# Patient Record
Sex: Male | Born: 1940
Health system: Southern US, Community
[De-identification: ages and names within clinical notes are randomized; demographics above are authoritative.]

## PROBLEM LIST (undated history)

## (undated) DIAGNOSIS — I10 Essential (primary) hypertension: Secondary | ICD-10-CM

## (undated) DIAGNOSIS — E785 Hyperlipidemia, unspecified: Secondary | ICD-10-CM

## (undated) DIAGNOSIS — I2510 Atherosclerotic heart disease of native coronary artery without angina pectoris: Secondary | ICD-10-CM

## (undated) DIAGNOSIS — Z862 Personal history of diseases of the blood and blood-forming organs and certain disorders involving the immune mechanism: Secondary | ICD-10-CM

## (undated) DIAGNOSIS — R7611 Nonspecific reaction to tuberculin skin test without active tuberculosis: Secondary | ICD-10-CM

## (undated) DIAGNOSIS — I255 Ischemic cardiomyopathy: Secondary | ICD-10-CM

## (undated) DIAGNOSIS — I252 Old myocardial infarction: Secondary | ICD-10-CM

## (undated) DIAGNOSIS — K922 Gastrointestinal hemorrhage, unspecified: Secondary | ICD-10-CM

## (undated) DIAGNOSIS — Z8619 Personal history of other infectious and parasitic diseases: Secondary | ICD-10-CM

## (undated) HISTORY — DX: Hyperlipidemia, unspecified: E78.5

## (undated) HISTORY — DX: Personal history of diseases of the blood and blood-forming organs and certain disorders involving the immune mechanism: Z86.2

## (undated) HISTORY — DX: Gastrointestinal hemorrhage, unspecified: K92.2

## (undated) HISTORY — DX: Essential (primary) hypertension: I10

## (undated) HISTORY — DX: Ischemic cardiomyopathy: I25.5

## (undated) HISTORY — DX: Atherosclerotic heart disease of native coronary artery without angina pectoris: I25.10

## (undated) HISTORY — DX: Personal history of other infectious and parasitic diseases: Z86.19

## (undated) HISTORY — DX: Nonspecific reaction to tuberculin skin test without active tuberculosis: R76.11

## (undated) HISTORY — DX: Old myocardial infarction: I25.2

---
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
## 2009-03-09 ENCOUNTER — Inpatient Hospital Stay (HOSPITAL_COMMUNITY): Admission: EM | Admit: 2009-03-09 | Discharge: 2009-03-14 | Payer: Self-pay | Admitting: Emergency Medicine

## 2009-03-09 HISTORY — PX: CARDIAC CATHETERIZATION: SHX172

## 2009-03-10 ENCOUNTER — Encounter: Payer: Self-pay | Admitting: Cardiology

## 2009-03-10 HISTORY — PX: ESOPHAGOGASTRODUODENOSCOPY: SHX1529

## 2009-04-18 ENCOUNTER — Inpatient Hospital Stay (HOSPITAL_COMMUNITY): Admission: RE | Admit: 2009-04-18 | Discharge: 2009-04-19 | Payer: Self-pay | Admitting: Cardiology

## 2009-04-18 HISTORY — PX: CARDIAC CATHETERIZATION: SHX172

## 2010-01-09 ENCOUNTER — Ambulatory Visit (HOSPITAL_COMMUNITY): Admission: RE | Admit: 2010-01-09 | Discharge: 2010-01-09 | Payer: Self-pay | Admitting: Surgery

## 2010-04-28 ENCOUNTER — Ambulatory Visit: Payer: Self-pay | Admitting: Cardiology

## 2010-09-20 LAB — DIFFERENTIAL
Eosinophils Absolute: 0.1 10*3/uL (ref 0.0–0.7)
Eosinophils Relative: 2 % (ref 0–5)
Lymphs Abs: 1.4 10*3/uL (ref 0.7–4.0)
Monocytes Absolute: 0.4 10*3/uL (ref 0.1–1.0)
Monocytes Relative: 8 % (ref 3–12)
Neutrophils Relative %: 68 % (ref 43–77)

## 2010-09-20 LAB — CBC
HCT: 42.1 % (ref 39.0–52.0)
RBC: 4.69 MIL/uL (ref 4.22–5.81)

## 2010-09-20 LAB — URINALYSIS, ROUTINE W REFLEX MICROSCOPIC
Glucose, UA: NEGATIVE mg/dL
Hgb urine dipstick: NEGATIVE
Specific Gravity, Urine: 1.038 — ABNORMAL HIGH (ref 1.005–1.030)
Urobilinogen, UA: 1 mg/dL (ref 0.0–1.0)
pH: 5 (ref 5.0–8.0)

## 2010-09-20 LAB — BASIC METABOLIC PANEL
Chloride: 108 mEq/L (ref 96–112)
GFR calc Af Amer: >60 mL/min (ref >60)
GFR calc non Af Amer: >60 mL/min (ref >60)
Glucose, Bld: 93 mg/dL (ref 70–99)

## 2010-09-20 LAB — PROTIME-INR
INR: 1.04 (ref 0.00–1.49)
Prothrombin Time: 13.5 seconds (ref 11.6–15.2)

## 2010-10-02 ENCOUNTER — Other Ambulatory Visit: Payer: Self-pay | Admitting: *Deleted

## 2010-10-02 DIAGNOSIS — I2510 Atherosclerotic heart disease of native coronary artery without angina pectoris: Secondary | ICD-10-CM

## 2010-10-02 MED ORDER — CLOPIDOGREL BISULFATE 75 MG PO TABS: 75.0000 mg | ORAL_TABLET | Freq: Every day | ORAL | Status: DC

## 2010-10-02 NOTE — Telephone Encounter (Signed)
escribe medication per fax request  

## 2010-10-08 LAB — BASIC METABOLIC PANEL
GFR calc Af Amer: >60 mL/min (ref >60)
Potassium: 4.1 mEq/L (ref 3.5–5.1)

## 2010-10-08 LAB — CBC
Hemoglobin: 11.6 g/dL — ABNORMAL LOW (ref 13.0–17.0)
Platelets: 163 10*3/uL (ref 150–400)
RDW: 14.6 % (ref 11.5–15.5)

## 2010-10-09 LAB — DIFFERENTIAL
Basophils Absolute: 0 10*3/uL (ref 0.0–0.1)
Basophils Relative: 1 % (ref 0–1)
Lymphs Abs: 2.6 10*3/uL (ref 0.7–4.0)
Monocytes Absolute: 0.5 10*3/uL (ref 0.1–1.0)
Monocytes Relative: 7 % (ref 3–12)
Neutrophils Relative %: 55 % (ref 43–77)

## 2010-10-09 LAB — HEMOGLOBIN AND HEMATOCRIT, BLOOD
HCT: 25.1 % — ABNORMAL LOW (ref 39.0–52.0)
HCT: 25.9 % — ABNORMAL LOW (ref 39.0–52.0)
HCT: 26.2 % — ABNORMAL LOW (ref 39.0–52.0)
HCT: 26.6 % — ABNORMAL LOW (ref 39.0–52.0)
HCT: 26.8 % — ABNORMAL LOW (ref 39.0–52.0)
HCT: 27 % — ABNORMAL LOW (ref 39.0–52.0)
HCT: 27.6 % — ABNORMAL LOW (ref 39.0–52.0)
HCT: 28 % — ABNORMAL LOW (ref 39.0–52.0)
HCT: 29.1 % — ABNORMAL LOW (ref 39.0–52.0)
HCT: 30.9 % — ABNORMAL LOW (ref 39.0–52.0)
HCT: 31.6 % — ABNORMAL LOW (ref 39.0–52.0)
Hemoglobin: 10.6 g/dL — ABNORMAL LOW (ref 13.0–17.0)
Hemoglobin: 10.8 g/dL — ABNORMAL LOW (ref 13.0–17.0)
Hemoglobin: 8.5 g/dL — ABNORMAL LOW (ref 13.0–17.0)
Hemoglobin: 8.6 g/dL — ABNORMAL LOW (ref 13.0–17.0)
Hemoglobin: 8.9 g/dL — ABNORMAL LOW (ref 13.0–17.0)
Hemoglobin: 9.1 g/dL — ABNORMAL LOW (ref 13.0–17.0)
Hemoglobin: 9.2 g/dL — ABNORMAL LOW (ref 13.0–17.0)
Hemoglobin: 9.2 g/dL — ABNORMAL LOW (ref 13.0–17.0)
Hemoglobin: 9.4 g/dL — ABNORMAL LOW (ref 13.0–17.0)
Hemoglobin: 9.6 g/dL — ABNORMAL LOW (ref 13.0–17.0)
Hemoglobin: 9.6 g/dL — ABNORMAL LOW (ref 13.0–17.0)

## 2010-10-09 LAB — CARDIAC PANEL(CRET KIN+CKTOT+MB+TROPI)
CK, MB: 210.7 ng/mL — ABNORMAL HIGH (ref 0.3–4.0)
CK, MB: 279.5 ng/mL — ABNORMAL HIGH (ref 0.3–4.0)
Relative Index: 9.4 — ABNORMAL HIGH (ref 0.0–2.5)
Relative Index: 9.9 — ABNORMAL HIGH (ref 0.0–2.5)
Total CK: 1633 U/L — ABNORMAL HIGH (ref 7–232)
Troponin I: 77.92 ng/mL (ref 0.00–0.06)
Troponin I: >100 ng/mL (ref 0.00–0.06)

## 2010-10-09 LAB — CBC
HCT: 32.7 % — ABNORMAL LOW (ref 39.0–52.0)
HCT: 44.8 % (ref 39.0–52.0)
Hemoglobin: 14.6 g/dL (ref 13.0–17.0)
MCHC: 32.6 g/dL (ref 30.0–36.0)
MCV: 89.3 fL (ref 78.0–100.0)
Platelets: 206 10*3/uL (ref 150–400)
RBC: 3.69 MIL/uL — ABNORMAL LOW (ref 4.22–5.81)
RBC: 5.02 MIL/uL (ref 4.22–5.81)
WBC: 11.7 10*3/uL — ABNORMAL HIGH (ref 4.0–10.5)
WBC: 7.2 10*3/uL (ref 4.0–10.5)

## 2010-10-09 LAB — COMPREHENSIVE METABOLIC PANEL
ALT: 44 U/L (ref 0–53)
Albumin: 2.9 g/dL — ABNORMAL LOW (ref 3.5–5.2)
BUN: 24 mg/dL — ABNORMAL HIGH (ref 6–23)
Calcium: 8 mg/dL — ABNORMAL LOW (ref 8.4–10.5)
Chloride: 112 mEq/L (ref 96–112)
Creatinine, Ser: 0.95 mg/dL (ref 0.4–1.5)
Total Bilirubin: 0.7 mg/dL (ref 0.3–1.2)
Total Protein: 5.1 g/dL — ABNORMAL LOW (ref 6.0–8.3)

## 2010-10-09 LAB — HEMOGLOBIN A1C: Hgb A1c MFr Bld: 5.6 % (ref 4.6–6.1)

## 2010-10-09 LAB — LIPID PANEL
Cholesterol: 215 mg/dL — ABNORMAL HIGH (ref 0–200)
HDL: 42 mg/dL (ref >39)
Total CHOL/HDL Ratio: 5.1 RATIO
Triglycerides: 157 mg/dL — ABNORMAL HIGH (ref <150)
VLDL: 23 mg/dL (ref 0–40)

## 2010-10-09 LAB — CROSSMATCH
ABO/RH(D): A POS
Antibody Screen: NEGATIVE

## 2010-10-09 LAB — PROTIME-INR
INR: 1.1 (ref 0.00–1.49)
Prothrombin Time: 13.9 seconds (ref 11.6–15.2)

## 2010-10-09 LAB — POCT CARDIAC MARKERS: Myoglobin, poc: 115 ng/mL (ref 12–200)

## 2010-10-09 LAB — TSH: TSH: 2.902 u[IU]/mL (ref 0.350–4.500)

## 2010-10-09 LAB — APTT: aPTT: 21 seconds — ABNORMAL LOW (ref 24–37)

## 2010-10-09 LAB — BRAIN NATRIURETIC PEPTIDE: Pro B Natriuretic peptide (BNP): 78 pg/mL (ref 0.0–100.0)

## 2010-10-09 LAB — MAGNESIUM: Magnesium: 2.2 mg/dL (ref 1.5–2.5)

## 2011-01-12 ENCOUNTER — Encounter: Payer: Self-pay | Admitting: Cardiology

## 2011-01-19 ENCOUNTER — Encounter: Payer: Self-pay | Admitting: Cardiology

## 2011-01-19 ENCOUNTER — Ambulatory Visit (INDEPENDENT_AMBULATORY_CARE_PROVIDER_SITE_OTHER): Payer: 59 | Admitting: Cardiology

## 2011-01-19 DIAGNOSIS — E7800 Pure hypercholesterolemia, unspecified: Secondary | ICD-10-CM

## 2011-01-19 DIAGNOSIS — I252 Old myocardial infarction: Secondary | ICD-10-CM

## 2011-01-19 DIAGNOSIS — I10 Essential (primary) hypertension: Secondary | ICD-10-CM

## 2011-01-19 DIAGNOSIS — E785 Hyperlipidemia, unspecified: Secondary | ICD-10-CM

## 2011-01-19 DIAGNOSIS — I2589 Other forms of chronic ischemic heart disease: Secondary | ICD-10-CM

## 2011-01-19 DIAGNOSIS — I255 Ischemic cardiomyopathy: Secondary | ICD-10-CM | POA: Insufficient documentation

## 2011-01-19 DIAGNOSIS — I2510 Atherosclerotic heart disease of native coronary artery without angina pectoris: Secondary | ICD-10-CM

## 2011-01-19 MED ORDER — METOPROLOL TARTRATE 25 MG PO TABS: 25.0000 mg | ORAL_TABLET | Freq: Two times a day (BID) | ORAL | Status: DC

## 2011-01-19 MED ORDER — ROSUVASTATIN CALCIUM 20 MG PO TABS: 20.0000 mg | ORAL_TABLET | Freq: Every day | ORAL | Status: DC

## 2011-01-19 MED ORDER — RAMIPRIL 2.5 MG PO TABS: 5.0000 mg | ORAL_TABLET | Freq: Every day | ORAL | Status: DC

## 2011-01-19 MED ORDER — CLOPIDOGREL BISULFATE 75 MG PO TABS: 75.0000 mg | ORAL_TABLET | Freq: Every day | ORAL | Status: DC

## 2011-01-19 NOTE — Patient Instructions (Signed)
Increase your ramipril to 10 mg daily.  Monitor your blood pressure   We will see you again in 6 months with fasting lab.

## 2011-01-19 NOTE — Assessment & Plan Note (Addendum)
He is clinically doing well and is on appropriate medical therapy. We will follow up again in 6 months and plan on checking fasting lab work at that time. I would consider a followup nuclear stress test after his next visit.

## 2011-01-19 NOTE — Progress Notes (Signed)
   Zachary Liu Date of Birth: [birth year]   History of Present Illness: Zachary Liu is seen today for followup. He states he has been feeling very well. He remains active. He has had no significant chest pain, shortness of breath, or palpitations. He hasn't checked his blood pressure at all. He has fully recovered from his hernia surgery.  Current Outpatient Prescriptions on File Prior to Visit  Medication Sig Dispense Refill  . aspirin 325 MG tablet Take 81 mg by mouth daily.       Zachary Liu Sodium (COLACE PO) Take by mouth daily.        Marland Kitchen DISCONTD: clopidogrel (PLAVIX) 75 MG tablet Take 1 tablet (75 mg total) by mouth daily.  90 tablet  3  . DISCONTD: metoprolol tartrate (LOPRESSOR) 25 MG tablet Take 25 mg by mouth 2 (two) times daily.        Marland Kitchen DISCONTD: ramipril (ALTACE) 2.5 MG tablet Take 2.5 mg by mouth daily.        Marland Kitchen DISCONTD: rosuvastatin (CRESTOR) 20 MG tablet Take 20 mg by mouth daily.        . pantoprazole (PROTONIX) 40 MG tablet Take 40 mg by mouth daily.          No Known Allergies  Past Medical History  Diagnosis Date  . Hyperlipidemia   . Coronary artery disease   . Myocardial infarction of anterior wall greater than eight weeks ago   . Ischemic cardiomyopathy   . Inguinal hernia   . GI bleed     due to a Mallory Weiss tear    Past Surgical History  Procedure Date  . Esophagogastroduodenoscopy 03/10/2009    Mallory-Weiss tears, one of them was actively bleeding/these were injected with epinephrine with control of hemorrhage  . Cardiac catheterization 04/18/2009    with stenting/successful intracoronary stenting of the proximal rt coronary artery with a drug-cluting stent    . Cardiac catheterization 03/09/2009    EF30-35%/severe 3-vessel obstructive atherosclerotic coronary artery disease,this is culprit/total occlusion of the proximal LAD/high-grade stenosis in the proximal rt coronary artery/subsequently noted there was some mod to severe stenosis in  the mid LAD/successful intracoronary stenting of the proximal&mid LAD using drug-eluting stents/severe lt ventricular dysfunction    History  Smoking status  . Former Smoker  . Quit date: 07/06/1999  Smokeless tobacco  . Not on file    History  Alcohol Use No    Family History  Problem Relation Age of Onset  . Peripheral vascular disease Mother   . Valvular heart disease Brother     Review of Systems: All other systems were reviewed and are negative.  Physical Exam: BP 142/94  Pulse 68  Ht 6\' 1"  (1.854 m)  Wt 222 lb (100.699 kg)  BMI 29.29 kg/m2 He is a pleasant white male in no acute distress. He is normocephalic, atraumatic. Pupils equal round and reactive. Sclera are clear. Neck is supple without JVD, adenopathy, thyromegaly, or bruits. Lungs are clear. Cardiac exam reveals a regular rate and rhythm without gallop, murmur, or click. Abdomen is soft and nontender without masses or bruits. Extremities are without edema. Pulses are 2+ and symmetric. He is alert and oriented x3. Cranial nerves II through XII are intact. LABORATORY DATA:   Assessment / Plan:

## 2011-01-19 NOTE — Assessment & Plan Note (Signed)
Blood pressure is significantly elevated today. We will increase his ramipril to 10 mg daily and have asked that he monitor his blood pressure at home.

## 2011-01-19 NOTE — Assessment & Plan Note (Signed)
We'll increase his ACE inhibitor. He will continue on his current dose of metoprolol. He is euvolemic.

## 2011-01-22 ENCOUNTER — Telehealth: Payer: Self-pay | Admitting: Cardiology

## 2011-01-22 ENCOUNTER — Encounter: Payer: Self-pay | Admitting: Cardiology

## 2011-01-22 NOTE — Telephone Encounter (Signed)
Called wanting to discuss his new prescriptions that Dr. Swaziland prescribed before his wife picks them up from the pharmacy. He also has not taken Protonics any more. Please call back. I have pulled the chart.

## 2011-01-22 NOTE — Telephone Encounter (Signed)
Called stating BP at home has always been around 120-117/68-70. Really does not want to change dose of Ramipril to 10 mg daily. Per Dr. Swaziland as long as he keeps check on BP may stay on 2.5 mg daily. States he will watch BP and if is elevated will call us.

## 2011-02-07 ENCOUNTER — Inpatient Hospital Stay (INDEPENDENT_AMBULATORY_CARE_PROVIDER_SITE_OTHER)
Admission: RE | Admit: 2011-02-07 | Discharge: 2011-02-07 | Disposition: A | Discharge status: Home / Self Care | Payer: 59 | Source: Ambulatory Visit | Attending: Family Medicine | Admitting: Family Medicine

## 2011-02-07 DIAGNOSIS — B029 Zoster without complications: Secondary | ICD-10-CM

## 2011-04-29 ENCOUNTER — Other Ambulatory Visit: Payer: Self-pay | Admitting: *Deleted

## 2011-04-29 ENCOUNTER — Other Ambulatory Visit: Payer: Self-pay | Admitting: Cardiology

## 2011-04-29 DIAGNOSIS — I10 Essential (primary) hypertension: Secondary | ICD-10-CM

## 2011-04-29 MED ORDER — NITROGLYCERIN 0.4 MG SL SUBL: 0.4000 mg | SUBLINGUAL_TABLET | SUBLINGUAL | Status: DC | PRN

## 2011-04-29 MED ORDER — METOPROLOL TARTRATE 25 MG PO TABS: 25.0000 mg | ORAL_TABLET | Freq: Two times a day (BID) | ORAL | Status: DC

## 2011-04-29 NOTE — Telephone Encounter (Signed)
Pt stated Md had also prescribed nitro tablet and pt would like a rx for that as well. Please return pt call when RX's are called in.

## 2011-07-06 HISTORY — PX: HERNIA REPAIR: SHX51

## 2011-08-24 ENCOUNTER — Other Ambulatory Visit: Payer: Self-pay | Admitting: *Deleted

## 2011-08-24 ENCOUNTER — Encounter: Payer: Self-pay | Admitting: Cardiology

## 2011-08-24 ENCOUNTER — Other Ambulatory Visit (INDEPENDENT_AMBULATORY_CARE_PROVIDER_SITE_OTHER): Payer: 59

## 2011-08-24 ENCOUNTER — Ambulatory Visit (INDEPENDENT_AMBULATORY_CARE_PROVIDER_SITE_OTHER): Payer: 59 | Admitting: Cardiology

## 2011-08-24 VITALS — BP 124/82 | HR 52 | Ht 73.0 in | Wt 225.0 lb

## 2011-08-24 DIAGNOSIS — E785 Hyperlipidemia, unspecified: Secondary | ICD-10-CM

## 2011-08-24 DIAGNOSIS — I2589 Other forms of chronic ischemic heart disease: Secondary | ICD-10-CM

## 2011-08-24 DIAGNOSIS — I255 Ischemic cardiomyopathy: Secondary | ICD-10-CM

## 2011-08-24 DIAGNOSIS — I2510 Atherosclerotic heart disease of native coronary artery without angina pectoris: Secondary | ICD-10-CM

## 2011-08-24 DIAGNOSIS — I10 Essential (primary) hypertension: Secondary | ICD-10-CM

## 2011-08-24 LAB — LIPID PANEL
HDL: 50.7 mg/dL (ref >39.00)
Total CHOL/HDL Ratio: 3
Triglycerides: 79 mg/dL (ref 0.0–149.0)
VLDL: 15.8 mg/dL (ref 0.0–40.0)

## 2011-08-24 LAB — BASIC METABOLIC PANEL
Calcium: 9 mg/dL (ref 8.4–10.5)
Creatinine, Ser: 1.1 mg/dL (ref 0.4–1.5)
GFR: 71.61 mL/min (ref >60.00)
Sodium: 139 mEq/L (ref 135–145)

## 2011-08-24 LAB — HEPATIC FUNCTION PANEL
Alkaline Phosphatase: 49 U/L (ref 39–117)
Bilirubin, Direct: 0.1 mg/dL (ref 0.0–0.3)

## 2011-08-24 NOTE — Assessment & Plan Note (Signed)
Lab work today demonstrates excellent control of his lipids on Crestor therapy. We will continue his current treatment

## 2011-08-24 NOTE — Patient Instructions (Addendum)
Continue your current medications.  We will call with the results of your lab work today.  I will see you again in 6 months.  

## 2011-08-24 NOTE — Progress Notes (Signed)
   Zachary Liu Date of Birth: Mar 24, 1941   History of Present Illness: [name] is seen today for followup. He states he has been feeling very well. He remains active. He has had no significant chest pain, shortness of breath, or palpitations. He did have a bout of shingles 6 months ago involving his right lower back. This has resolved.  Current Outpatient Prescriptions on File Prior to Visit  Medication Sig Dispense Refill  . aspirin 325 MG tablet Take 81 mg by mouth daily.       . clopidogrel (PLAVIX) 75 MG tablet Take 1 tablet (75 mg total) by mouth daily.  90 tablet  3  . metoprolol tartrate (LOPRESSOR) 25 MG tablet Take 1 tablet (25 mg total) by mouth 2 (two) times daily.  90 tablet  3  . nitroGLYCERIN (NITROSTAT) 0.4 MG SL tablet Place 1 tablet (0.4 mg total) under the tongue every 5 (five) minutes as needed for chest pain.  25 tablet  11  . ramipril (ALTACE) 2.5 MG tablet Take 2.5 mg by mouth daily.        . rosuvastatin (CRESTOR) 20 MG tablet Take 1 tablet (20 mg total) by mouth daily.  90 tablet  3    No Known Allergies  Past Medical History  Diagnosis Date  . Hyperlipidemia   . Coronary artery disease   . Myocardial infarction of anterior wall greater than eight weeks ago   . Ischemic cardiomyopathy   . Inguinal hernia   . GI bleed     due to a Mallory Weiss tear  . HTN (hypertension)   . Hypercholesterolemia   . History of anemia     due to gastrointestinal blood loss    Past Surgical History  Procedure Date  . Esophagogastroduodenoscopy 03/10/2009    Mallory-Weiss tears, one of them was actively bleeding/these were injected with epinephrine with control of hemorrhage  . Cardiac catheterization 04/18/2009    with stenting/successful intracoronary stenting of the proximal rt coronary artery with a drug-cluting stent    . Cardiac catheterization 03/09/2009    EF30-35%/severe 3-vessel obstructive atherosclerotic coronary artery disease,this is  culprit/total occlusion of the proximal LAD/high-grade stenosis in the proximal rt coronary artery/subsequently noted there was some mod to severe stenosis in the mid LAD/successful intracoronary stenting of the proximal&mid LAD using drug-eluting stents/severe lt ventricular dysfunction    History  Smoking status  . Former Smoker  . Quit date: 07/06/1999  Smokeless tobacco  . Not on file    History  Alcohol Use No    Family History  Problem Relation Age of Onset  . Peripheral vascular disease Mother   . Valvular heart disease Brother     Review of Systems: As noted in history of present illness. All other systems were reviewed and are negative.  Physical Exam: BP 124/82  Pulse 52  Ht 6\' 1"  (1.854 m)  Wt 102.059 kg (225 lb)  BMI 29.69 kg/m2 He is a pleasant white male in no acute distress. He is normocephalic, atraumatic. Pupils equal round and reactive. Sclera are clear. Neck is supple without JVD, adenopathy, thyromegaly, or bruits. Lungs are clear. Cardiac exam reveals a regular rate and rhythm without gallop, murmur, or click. Abdomen is soft and nontender without masses or bruits. Extremities are without edema. Pulses are 2+ and symmetric. He is alert and oriented x3. Cranial nerves II through XII are intact. LABORATORY DATA: ECG demonstrates sinus bradycardia with a normal ECG.  Assessment / Plan:

## 2011-08-24 NOTE — Assessment & Plan Note (Signed)
Blood pressure control is satisfactory. 

## 2011-08-24 NOTE — Assessment & Plan Note (Signed)
He is on appropriate therapy with ACE inhibitor and beta blocker therapy. He has no evidence of volume overload. We'll continue his current therapy.

## 2011-08-24 NOTE — Assessment & Plan Note (Signed)
He remains asymptomatic. I'll followup again in 6 months. We will consider a stress Myoview study this summer since it'll be 3 years since his myocardial infarction.

## 2011-09-13 ENCOUNTER — Other Ambulatory Visit: Payer: Self-pay | Admitting: Cardiology

## 2011-09-13 DIAGNOSIS — E7800 Pure hypercholesterolemia, unspecified: Secondary | ICD-10-CM

## 2011-09-13 MED ORDER — RAMIPRIL 2.5 MG PO TABS: 2.5000 mg | ORAL_TABLET | Freq: Every day | ORAL | Status: DC

## 2011-09-13 MED ORDER — ROSUVASTATIN CALCIUM 20 MG PO TABS: 20.0000 mg | ORAL_TABLET | Freq: Every day | ORAL | Status: DC

## 2011-09-13 NOTE — Telephone Encounter (Signed)
Refill   Verified pharmacy as [facility], patient can be reached at hm# [phone] for additional info

## 2011-10-28 ENCOUNTER — Other Ambulatory Visit: Payer: Self-pay | Admitting: Cardiology

## 2012-03-28 ENCOUNTER — Other Ambulatory Visit: Payer: Self-pay | Admitting: Cardiology

## 2012-03-28 ENCOUNTER — Telehealth: Payer: Self-pay | Admitting: Cardiology

## 2012-03-28 NOTE — Telephone Encounter (Signed)
I spoke with Dr Elease Hashimoto s nurse she went ahead and sent refill in as take 1-2 tabs daily as directed/needed. Afterwards i called pt back and he was very thankful and ask can we keep the furosemide at 20mg  tabs because he takes i tab twice a day

## 2012-03-28 NOTE — Telephone Encounter (Signed)
Pt  requested refill of plavix at [facility]

## 2012-05-01 ENCOUNTER — Other Ambulatory Visit: Payer: Self-pay | Admitting: Cardiology

## 2012-06-12 ENCOUNTER — Other Ambulatory Visit: Payer: Self-pay

## 2012-06-12 MED ORDER — NITROGLYCERIN 0.4 MG SL SUBL: 0.4000 mg | SUBLINGUAL_TABLET | SUBLINGUAL | Status: DC | PRN

## 2012-09-04 ENCOUNTER — Other Ambulatory Visit: Payer: Self-pay | Admitting: Cardiology

## 2012-10-19 ENCOUNTER — Other Ambulatory Visit: Payer: Self-pay | Admitting: Cardiology

## 2012-10-26 ENCOUNTER — Other Ambulatory Visit: Payer: Self-pay | Admitting: Cardiology

## 2012-10-26 ENCOUNTER — Other Ambulatory Visit: Payer: Self-pay | Admitting: Emergency Medicine

## 2012-10-26 MED ORDER — RAMIPRIL 2.5 MG PO CAPS: ORAL_CAPSULE | ORAL | Status: DC

## 2012-10-26 MED ORDER — ROSUVASTATIN CALCIUM 20 MG PO TABS: ORAL_TABLET | ORAL | Status: DC

## 2012-10-26 MED ORDER — METOPROLOL TARTRATE 25 MG PO TABS: ORAL_TABLET | ORAL | Status: DC

## 2012-12-15 ENCOUNTER — Ambulatory Visit (INDEPENDENT_AMBULATORY_CARE_PROVIDER_SITE_OTHER): Payer: 59 | Admitting: Cardiology

## 2012-12-15 ENCOUNTER — Encounter: Payer: Self-pay | Admitting: Cardiology

## 2012-12-15 VITALS — BP 124/78 | HR 57 | Ht 73.0 in | Wt 226.0 lb

## 2012-12-15 DIAGNOSIS — I10 Essential (primary) hypertension: Secondary | ICD-10-CM

## 2012-12-15 DIAGNOSIS — I2510 Atherosclerotic heart disease of native coronary artery without angina pectoris: Secondary | ICD-10-CM

## 2012-12-15 DIAGNOSIS — I252 Old myocardial infarction: Secondary | ICD-10-CM

## 2012-12-15 DIAGNOSIS — E785 Hyperlipidemia, unspecified: Secondary | ICD-10-CM

## 2012-12-15 DIAGNOSIS — I255 Ischemic cardiomyopathy: Secondary | ICD-10-CM

## 2012-12-15 DIAGNOSIS — I2589 Other forms of chronic ischemic heart disease: Secondary | ICD-10-CM

## 2012-12-15 MED ORDER — CLOPIDOGREL BISULFATE 75 MG PO TABS: 75.0000 mg | ORAL_TABLET | Freq: Every day | ORAL | Status: DC

## 2012-12-15 MED ORDER — RAMIPRIL 2.5 MG PO CAPS: ORAL_CAPSULE | ORAL | Status: DC

## 2012-12-15 MED ORDER — ROSUVASTATIN CALCIUM 20 MG PO TABS: ORAL_TABLET | ORAL | Status: DC

## 2012-12-15 MED ORDER — NITROGLYCERIN 0.4 MG SL SUBL: 0.4000 mg | SUBLINGUAL_TABLET | SUBLINGUAL | Status: DC | PRN

## 2012-12-15 MED ORDER — METOPROLOL TARTRATE 25 MG PO TABS: ORAL_TABLET | ORAL | Status: DC

## 2012-12-15 NOTE — Progress Notes (Signed)
Eulogio Ditch Date of Birth: 14-Aug-1940   History of Present Illness: Mr. Mierzwa is seen today for followup. He is status post anterior myocardial infarction in September 2010. He had fairly extensive stenting of the proximal and mid LAD. He also had a long stent placed in the proximal right coronary. He states he has been feeling very well. He remains active. He has had no significant chest pain, shortness of breath, or palpitations.   Current outpatient prescriptions:aspirin 81 MG tablet, Take 81 mg by mouth daily., Disp: , Rfl: ;  clopidogrel (PLAVIX) 75 MG tablet, Take 1 tablet (75 mg total) by mouth daily., Disp: 90 tablet, Rfl: 3;  metoprolol tartrate (LOPRESSOR) 25 MG tablet, TAKE 1 TABLET BY MOUTH 2 TIMES DAILY., Disp: 180 tablet, Rfl: 3 nitroGLYCERIN (NITROSTAT) 0.4 MG SL tablet, Place 1 tablet (0.4 mg total) under the tongue every 5 (five) minutes as needed for chest pain., Disp: 25 tablet, Rfl: 11;  ramipril (ALTACE) 2.5 MG capsule, TAKE 1 CAPSULE BY MOUTH DAILY, Disp: 90 capsule, Rfl: 3;  rosuvastatin (CRESTOR) 20 MG tablet, TAKE 1 TABLET BY MOUTH ONCE DAILY, Disp: 90 tablet, Rfl: 3  No Known Allergies  Past Medical History  Diagnosis Date  . Hyperlipidemia   . Coronary artery disease   . Myocardial infarction of anterior wall greater than eight weeks ago   . Ischemic cardiomyopathy   . Inguinal hernia   . GI bleed     due to a Mallory Weiss tear  . HTN (hypertension)   . Hypercholesterolemia   . History of anemia     due to gastrointestinal blood loss    Past Surgical History  Procedure Laterality Date  . Esophagogastroduodenoscopy  03/10/2009    Mallory-Weiss tears, one of them was actively bleeding/these were injected with epinephrine with control of hemorrhage  . Cardiac catheterization  04/18/2009    with stenting/successful intracoronary stenting of the proximal rt coronary artery with a drug-cluting stent    . Cardiac catheterization  03/09/2009   EF30-35%/severe 3-vessel obstructive atherosclerotic coronary artery disease,this is culprit/total occlusion of the proximal LAD/high-grade stenosis in the proximal rt coronary artery/subsequently noted there was some mod to severe stenosis in the mid LAD/successful intracoronary stenting of the proximal&mid LAD using drug-eluting stents/severe lt ventricular dysfunction    History  Smoking status  . Former Smoker  . Quit date: 07/06/1999  Smokeless tobacco  . Not on file    History  Alcohol Use No    Family History  Problem Relation Age of Onset  . Peripheral vascular disease Mother   . Valvular heart disease Brother     Review of Systems: As noted in history of present illness. All other systems were reviewed and are negative.  Physical Exam: BP 124/78  Pulse 57  Ht 6\' 1"  (1.854 m)  Wt 226 lb (102.513 kg)  BMI 29.82 kg/m2 He is a pleasant white male in no acute distress. He is normocephalic, atraumatic. Pupils equal round and reactive. Sclera are clear. Neck is supple without JVD, adenopathy, thyromegaly, or bruits. Lungs are clear. Cardiac exam reveals a regular rate and rhythm without gallop, murmur, or click. Abdomen is soft and nontender without masses or bruits. Extremities are without edema. Pulses are 2+ and symmetric. He is alert and oriented x3. Cranial nerves II through XII are intact. LABORATORY DATA: ECG demonstrates sinus bradycardia with old septal infarct. No acute change.  Assessment / Plan: 1. Coronary disease with remote anterior myocardial infarction. Status post DES  to the proximal and mid LAD. Later DES placement to the proximal RCA. Patient remains asymptomatic. I recommended a followup nuclear stress test. Because of his deductable and coinsurance he would like to postpone this until January. We'll plan on scheduling at bedtime.  2. Ischemic cardiomyopathy.  3. Hypertension-controlled.  4. Hypercholesterolemia-will check fasting lab work including  chemistries and lipid panel as well as a CBC.

## 2012-12-15 NOTE — Patient Instructions (Signed)
Continue your current therapy  We will schedule you for fasting lab work  We will tentatively plan on a nuclear stress test in January.

## 2012-12-19 ENCOUNTER — Other Ambulatory Visit (INDEPENDENT_AMBULATORY_CARE_PROVIDER_SITE_OTHER): Payer: 59

## 2012-12-19 DIAGNOSIS — I10 Essential (primary) hypertension: Secondary | ICD-10-CM

## 2012-12-19 DIAGNOSIS — I2510 Atherosclerotic heart disease of native coronary artery without angina pectoris: Secondary | ICD-10-CM

## 2012-12-19 DIAGNOSIS — E785 Hyperlipidemia, unspecified: Secondary | ICD-10-CM

## 2012-12-19 DIAGNOSIS — I255 Ischemic cardiomyopathy: Secondary | ICD-10-CM

## 2012-12-19 DIAGNOSIS — I252 Old myocardial infarction: Secondary | ICD-10-CM

## 2012-12-19 DIAGNOSIS — I2589 Other forms of chronic ischemic heart disease: Secondary | ICD-10-CM

## 2012-12-19 LAB — BASIC METABOLIC PANEL
BUN: 25 mg/dL — ABNORMAL HIGH (ref 6–23)
Calcium: 9.2 mg/dL (ref 8.4–10.5)
Creatinine, Ser: 1.1 mg/dL (ref 0.4–1.5)
GFR: 69.84 mL/min (ref >60.00)
Potassium: 4.9 mEq/L (ref 3.5–5.1)

## 2012-12-19 LAB — CBC WITH DIFFERENTIAL/PLATELET
Eosinophils Relative: 2 % (ref 0.0–5.0)
Lymphocytes Relative: 28.2 % (ref 12.0–46.0)
Monocytes Relative: 7.7 % (ref 3.0–12.0)
Neutrophils Relative %: 61.7 % (ref 43.0–77.0)
Platelets: 173 10*3/uL (ref 150.0–400.0)
WBC: 5.1 10*3/uL (ref 4.5–10.5)

## 2012-12-19 LAB — LIPID PANEL
Cholesterol: 114 mg/dL (ref 0–200)
LDL Cholesterol: 58 mg/dL (ref 0–99)
Triglycerides: 58 mg/dL (ref 0.0–149.0)
VLDL: 11.6 mg/dL (ref 0.0–40.0)

## 2012-12-19 LAB — HEPATIC FUNCTION PANEL
ALT: 14 U/L (ref 0–53)
Albumin: 3.7 g/dL (ref 3.5–5.2)
Total Protein: 6.6 g/dL (ref 6.0–8.3)

## 2013-09-03 ENCOUNTER — Telehealth: Payer: Self-pay | Admitting: Cardiology

## 2013-09-03 DIAGNOSIS — I2510 Atherosclerotic heart disease of native coronary artery without angina pectoris: Secondary | ICD-10-CM

## 2013-09-03 NOTE — Telephone Encounter (Signed)
Follow up    Pt called to schedule his Stress test.  To Schedule this test we need the type stress test.   Please your scheduler know.

## 2013-09-03 NOTE — Telephone Encounter (Signed)
Returned call to patient he stated he needed to schedule stress test.Schedulers will call back to schedule stress myoview.Advised to hold metoprolol 24 hrs before myoview.Instructions will be mailed to patient.

## 2013-10-04 ENCOUNTER — Ambulatory Visit (HOSPITAL_COMMUNITY): Payer: 59 | Attending: Cardiology | Admitting: Radiology

## 2013-10-04 VITALS — BP 113/78 | Ht 73.0 in | Wt 213.0 lb

## 2013-10-04 DIAGNOSIS — I2510 Atherosclerotic heart disease of native coronary artery without angina pectoris: Secondary | ICD-10-CM | POA: Insufficient documentation

## 2013-10-04 DIAGNOSIS — I428 Other cardiomyopathies: Secondary | ICD-10-CM | POA: Insufficient documentation

## 2013-10-04 DIAGNOSIS — I4949 Other premature depolarization: Secondary | ICD-10-CM

## 2013-10-04 DIAGNOSIS — Z87891 Personal history of nicotine dependence: Secondary | ICD-10-CM | POA: Insufficient documentation

## 2013-10-04 DIAGNOSIS — R9439 Abnormal result of other cardiovascular function study: Secondary | ICD-10-CM | POA: Insufficient documentation

## 2013-10-04 DIAGNOSIS — I10 Essential (primary) hypertension: Secondary | ICD-10-CM | POA: Insufficient documentation

## 2013-10-04 MED ORDER — TECHNETIUM TC 99M SESTAMIBI GENERIC - CARDIOLITE
30.0000 | Freq: Once | INTRAVENOUS | Status: AC | PRN
  Administered 2013-10-04: 30 via INTRAVENOUS

## 2013-10-04 MED ORDER — TECHNETIUM TC 99M SESTAMIBI GENERIC - CARDIOLITE
11.0000 | Freq: Once | INTRAVENOUS | Status: AC | PRN
  Administered 2013-10-04: 11 via INTRAVENOUS

## 2013-10-04 NOTE — Progress Notes (Signed)
[facility] [address] [place], [place] [address] [phone]    Cardiology Nuclear Med Study  Zachary Liu is a 73 y.o. male     MRN : [id number]     DOB: Feb 18, 1941  Procedure Date: 10/04/2013  Nuclear Med Background Indication for Stress Test:  Evaluation for Ischemia;Stent Patency History: CAD;MI;(2010) CATH(2010) ;STENT Artery Prox+mid/Rca 10') Echo 2010' EF:35-40%;Cardiomyopathy Cardiac Risk Factors: History of Smoking, Hypertension and Lipids  Symptoms:  no known complaints   Nuclear Pre-Procedure Caffeine/Decaff Intake:  7:00pm NPO After: 7:00pm   Lungs:  clear O2 Sat: 97% on room air. IV 0.9% NS with Angio Cath:  20g  IV Site: R Antecubital  IV Started by:  Ileene Hutchinson, EMT-P  Chest Size (in):  46 Cup Size: n/a  Height: 6\' 1"  (1.854 m)  Weight:  213 lb (96.616 kg)  BMI:  Body mass index is 28.11 kg/(m^2). Tech Comments:  held lopressor for 24 hrs    Nuclear Med Study 1 or 2 day study: 1 day  Stress Test Type:  Stress  Reading MD: n/a  Order Authorizing Provider:  Peter Martinique.MD.  Resting Radionuclide: Technetium 63m Tetrofosmin  Resting Radionuclide Dose: 11.0 mCi   Stress Radionuclide:  Technetium 5m Tetrofosmin  Stress Radionuclide Dose: 32.0 mCi           Stress Protocol Rest HR: 65 Stress HR: 151  Rest BP: 113/78 Stress BP: 148/69  Exercise Time (min): 7:01 METS: 8.50   Predicted Max HR: 147 bpm % Max HR: 102.72 bpm Rate Pressure Product: 22348   Dose of Adenosine (mg):  n/a Dose of Lexiscan: n/a mg  Dose of Atropine (mg): n/a Dose of Dobutamine: n/a mcg/kg/min (at max HR)  Stress Test Technologist: Ileene Hutchinson, EMT-P  Nuclear Technologist:  Charlton Amor, CNMT     Rest Procedure:  Myocardial perfusion imaging was performed at rest 45 minutes following the intravenous administration of Technetium 2m Sestamibi. Rest ECG: Sinus rhythm, anterior MI.  Stress Procedure:  The patient exercised on the  treadmill utilizing the Bruce Protocol for 7:01 minutes. The patient stopped due to fatigue and denied any chest pain.Pt having frequent PVCs.Technetium 57m Sestamibi was injected at peak exercise and myocardial perfusion imaging was performed after a brief delay. Stress ECG: No significant ST segment change suggestive of ischemia.  QPS Raw Data Images:  Acquisition technically good; LVE. Stress Images:  There is decreased uptake in the distal anterior wall, apex and inferior wall. Rest Images:  There is decreased uptake in the distal anterior wall, apex and distal inferior wall. Subtraction (SDS):  No evidence of ischemia. Transient Ischemic Dilatation (Normal <1.22):  1.08 Lung/Heart Ratio (Normal <0.45):  0.38  Quantitative Gated Spect Images QGS EDV:  144 ml QGS ESV:  82 ml  Impression Exercise Capacity:  Fair exercise capacity. BP Response:  Normal blood pressure response. Clinical Symptoms:  No chest pain or dyspnea. ECG Impression:  No significant ST segment change suggestive of ischemia. Comparison with Prior Nuclear Study: No previous nuclear study performed  Overall Impression:  Intermediate risk stress nuclear study with a large, severe intensity, fixed distal anterior, apical and inferior defect consistent with prior infarct; no ischemia.  LV Ejection Fraction: 43%.  LV Wall Motion:  Global hypokinesis and apical akinesis.  Zachary Liu

## 2013-10-11 ENCOUNTER — Telehealth: Payer: Self-pay | Admitting: *Deleted

## 2013-10-11 NOTE — Telephone Encounter (Signed)
Patient calling to see if Crestor can be switched to another cost effective statin. He says yesterday he talked to a nurse and asked for a cheaper blood thinner other than Plavix. He did not mean to say Plavix, he meant to say Crestor. He does have enough Crestor to last him while Dr. Martinique makes the decision to switch statins. He would like it called in to [facility] and would like a call back. I let him know I will forward this message to Dr. Doug Sou nurse Ms. Cheryl.

## 2013-10-22 ENCOUNTER — Telehealth: Payer: Self-pay

## 2013-10-22 MED ORDER — ATORVASTATIN CALCIUM 40 MG PO TABS: 40.0000 mg | ORAL_TABLET | Freq: Every day | ORAL | Status: DC

## 2013-10-22 NOTE — Telephone Encounter (Signed)
Received message crestor cost too much.Spoke to [place] he advised stop crestor,start atorvastatin 40 mg daily.Advised ok to finish crestor then start atorvastatin.Prescription sent to pharmacy.

## 2013-12-10 ENCOUNTER — Other Ambulatory Visit: Payer: Self-pay | Admitting: Cardiology

## 2014-03-13 ENCOUNTER — Other Ambulatory Visit: Payer: Self-pay | Admitting: Cardiology

## 2014-04-16 ENCOUNTER — Other Ambulatory Visit: Payer: Self-pay | Admitting: Cardiology

## 2014-04-23 NOTE — Progress Notes (Signed)
HPI: FU CAD; previously followed by Dr Martinique. He is status post anterior myocardial infarction in September 2010. He had fairly extensive stenting of the proximal and mid LAD. He also had a long stent placed in the proximal right coronary. Echocardiogram in September 2010 showed an ejection fraction of 40-97%, grade 1 diastolic dysfunction and mild left atrial enlargement. Nuclear study April 2015 showed an ejection fraction of 43%. There is prior anterior, apical and inferior infarct but no ischemia.    Current Outpatient Prescriptions  Medication Sig Dispense Refill  . aspirin 81 MG tablet Take 81 mg by mouth daily.      Marland Kitchen atorvastatin (LIPITOR) 40 MG tablet Take 1 tablet (40 mg total) by mouth daily.  90 tablet  3  . clopidogrel (PLAVIX) 75 MG tablet Take 1 tablet (75 mg total) by mouth once.  30 tablet  6  . metoprolol tartrate (LOPRESSOR) 25 MG tablet Take 1 tablet (25 mg total) by mouth 2 (two) times daily.  60 tablet  6  . nitroGLYCERIN (NITROSTAT) 0.4 MG SL tablet Place 1 tablet (0.4 mg total) under the tongue every 5 (five) minutes as needed for chest pain.  25 tablet  11  . ramipril (ALTACE) 2.5 MG capsule Take 1 capsule (2.5 mg total) by mouth daily.  30 capsule  6   No current facility-administered medications for this visit.     Past Medical History  Diagnosis Date  . Hyperlipidemia   . Coronary artery disease   . Myocardial infarction of anterior wall greater than eight weeks ago   . Ischemic cardiomyopathy   . Inguinal hernia   . GI bleed     due to a Mallory Weiss tear  . HTN (hypertension)   . Hypercholesterolemia   . History of anemia     due to gastrointestinal blood loss    Past Surgical History  Procedure Laterality Date  . Esophagogastroduodenoscopy  03/10/2009    Mallory-Weiss tears, one of them was actively bleeding/these were injected with epinephrine with control of hemorrhage  . Cardiac catheterization  04/18/2009    with stenting/successful  intracoronary stenting of the proximal rt coronary artery with a drug-cluting stent    . Cardiac catheterization  03/09/2009    EF30-35%/severe 3-vessel obstructive atherosclerotic coronary artery disease,this is culprit/total occlusion of the proximal LAD/high-grade stenosis in the proximal rt coronary artery/subsequently noted there was some mod to severe stenosis in the mid LAD/successful intracoronary stenting of the proximal&mid LAD using drug-eluting stents/severe lt ventricular dysfunction    History   Social History  . Marital Status: Married    Spouse Name: N/A    Number of Children: 79  . Years of Education: N/A   Occupational History  . critical care nurse     retired   Social History Main Topics  . Smoking status: Former Smoker    Quit date: 07/06/1999  . Smokeless tobacco: Not on file  . Alcohol Use: No  . Drug Use: Not on file  . Sexual Activity: Not on file   Other Topics Concern  . Not on file   Social History Narrative  . No narrative on file    ROS: no fevers or chills, productive cough, hemoptysis, dysphasia, odynophagia, melena, hematochezia, dysuria, hematuria, rash, seizure activity, orthopnea, PND, pedal edema, claudication. Remaining systems are negative.  Physical Exam: Well-developed well-nourished in no acute distress.  Skin is warm and dry.  HEENT is normal.  Neck is supple.  Chest is  clear to auscultation with normal expansion.  Cardiovascular exam is regular rate and rhythm.  Abdominal exam nontender or distended. No masses palpated. Extremities show no edema. neuro grossly intact  ECG     This encounter was created in error - please disregard.

## 2014-04-24 ENCOUNTER — Telehealth: Payer: Self-pay | Admitting: Cardiology

## 2014-04-24 NOTE — Telephone Encounter (Signed)
Returned call to patient no answer.LMTC. 

## 2014-04-24 NOTE — Telephone Encounter (Signed)
New problem   Pt was on Dr Jacalyn Lefevre sched to come in tomorrow morning at 04/25/14 @8 :45..But this is a Dr Doug Sou pt. Pt thought the entire time he was seeing Dr Martinique. Pt would like a call back from nurse before 11:00 today to clear up this matter. Who is the pt suppose to see, b/c Dr Martinique doesn't have any appt and pt is upset. Please call pt.

## 2014-04-25 ENCOUNTER — Encounter: Payer: 59 | Admitting: Cardiology

## 2014-05-01 ENCOUNTER — Ambulatory Visit (INDEPENDENT_AMBULATORY_CARE_PROVIDER_SITE_OTHER): Payer: 59 | Admitting: Cardiology

## 2014-05-01 ENCOUNTER — Encounter: Payer: Self-pay | Admitting: Cardiology

## 2014-05-01 VITALS — BP 110/74 | HR 56 | Ht 72.0 in | Wt 218.8 lb

## 2014-05-01 DIAGNOSIS — I255 Ischemic cardiomyopathy: Secondary | ICD-10-CM

## 2014-05-01 DIAGNOSIS — Z23 Encounter for immunization: Secondary | ICD-10-CM

## 2014-05-01 DIAGNOSIS — E785 Hyperlipidemia, unspecified: Secondary | ICD-10-CM

## 2014-05-01 DIAGNOSIS — I10 Essential (primary) hypertension: Secondary | ICD-10-CM

## 2014-05-01 DIAGNOSIS — I2510 Atherosclerotic heart disease of native coronary artery without angina pectoris: Secondary | ICD-10-CM

## 2014-05-01 NOTE — Telephone Encounter (Signed)
Appointment scheduled with Dr.Jordan 05/01/14 at 9:15 am.

## 2014-05-01 NOTE — Progress Notes (Signed)
Zachary Liu Date of Birth: July 27, 1940   History of Present Illness: [name] is seen today for followup. He is status post anterior myocardial infarction in September 2010. He had fairly extensive stenting of the proximal and mid LAD. He also had a long stent placed in the proximal right coronary. He had a follow up Myoview study in April 2015 noted below. He states he has been feeling very well. He remains active. He has had no significant chest pain, shortness of breath, or palpitations. He has joined a gym and is getting some regular exercise.    Medication List       This list is accurate as of: 05/01/14  5:09 PM.  Always use your most recent med list.               aspirin 81 MG tablet  Take 81 mg by mouth daily.     atorvastatin 40 MG tablet  Commonly known as:  LIPITOR  Take 1 tablet (40 mg total) by mouth daily.     clopidogrel 75 MG tablet  Commonly known as:  PLAVIX  Take 1 tablet (75 mg total) by mouth once.     metoprolol tartrate 25 MG tablet  Commonly known as:  LOPRESSOR  Take 1 tablet (25 mg total) by mouth 2 (two) times daily.     nitroGLYCERIN 0.4 MG SL tablet  Commonly known as:  NITROSTAT  Place 1 tablet (0.4 mg total) under the tongue every 5 (five) minutes as needed for chest pain.     ramipril 2.5 MG capsule  Commonly known as:  ALTACE  Take 1 capsule (2.5 mg total) by mouth daily.        No Known Allergies  Past Medical History  Diagnosis Date  . Hyperlipidemia   . Coronary artery disease   . Myocardial infarction of anterior wall greater than eight weeks ago   . Ischemic cardiomyopathy   . Inguinal hernia   . GI bleed     due to a Mallory Weiss tear  . HTN (hypertension)   . Hypercholesterolemia   . History of anemia     due to gastrointestinal blood loss    Past Surgical History  Procedure Laterality Date  . Esophagogastroduodenoscopy  03/10/2009    Mallory-Weiss tears, one of them was actively bleeding/these  were injected with epinephrine with control of hemorrhage  . Cardiac catheterization  04/18/2009    with stenting/successful intracoronary stenting of the proximal rt coronary artery with a drug-cluting stent    . Cardiac catheterization  03/09/2009    EF30-35%/severe 3-vessel obstructive atherosclerotic coronary artery disease,this is culprit/total occlusion of the proximal LAD/high-grade stenosis in the proximal rt coronary artery/subsequently noted there was some mod to severe stenosis in the mid LAD/successful intracoronary stenting of the proximal&mid LAD using drug-eluting stents/severe lt ventricular dysfunction    History  Smoking status  . Former Smoker  . Quit date: 07/06/1999  Smokeless tobacco  . Not on file    History  Alcohol Use No    Family History  Problem Relation Age of Onset  . Peripheral vascular disease Mother   . Valvular heart disease Brother     Review of Systems: As noted in history of present illness. All other systems were reviewed and are negative.  Physical Exam: BP 110/74  Pulse 56  Ht 6' (1.829 m)  Wt 218 lb 12.8 oz (99.247 kg)  BMI 29.67 kg/m2 He is a pleasant white male in no  acute distress. He is normocephalic, atraumatic. Pupils equal round and reactive. Sclera are clear. Neck is supple without JVD, adenopathy, thyromegaly, or bruits. Lungs are clear. Cardiac exam reveals a regular rate and rhythm without gallop, murmur, or click. Abdomen is soft and nontender without masses or bruits. Extremities are without edema. Pulses are 2+ and symmetric. He is alert and oriented x3. Cranial nerves II through XII are intact.  LABORATORY DATA: ECG demonstrates sinus bradycardia with old septal infarct. Rate 56 bpm. No acute change.I have personally reviewed and interpreted this study.  Cardiology Nuclear Med Study  Zachary Liu is a 73 y.o. male MRN : [id number] DOB: 1940-09-03  Procedure Date: 10/04/2013  Nuclear Med Background  Indication for  Stress Test: Evaluation for Ischemia;Stent Patency  History: CAD;MI;(2010) CATH(2010) ;STENT Artery Prox+mid/Rca 10') Echo 2010' EF:35-40%;Cardiomyopathy  Cardiac Risk Factors: History of Smoking, Hypertension and Lipids  Symptoms: no known complaints  Nuclear Pre-Procedure  Caffeine/Decaff Intake: 7:00pm  NPO After: 7:00pm   Lungs: clear  O2 Sat: 97% on room air.  IV 0.9% NS with Angio Cath: 20g   IV Site: R Antecubital  IV Started by: Ileene Hutchinson, EMT-P   Chest Size (in): 46  Cup Size: n/a   Height: 6\' 1"  (1.854 m)  Weight: 213 lb (96.616 kg)   BMI: Body mass index is 28.11 kg/(m^2).  Tech Comments: held lopressor for 24 hrs   Nuclear Med Study  1 or 2 day study: 1 day  Stress Test Type: Stress   Reading MD: n/a  Order Authorizing Provider: Peter Martinique.MD.   Resting Radionuclide: Technetium 46m Tetrofosmin  Resting Radionuclide Dose: 11.0 mCi   Stress Radionuclide: Technetium 47m Tetrofosmin  Stress Radionuclide Dose: 32.0 mCi   Stress Protocol  Rest HR: 65  Stress HR: 151   Rest BP: 113/78  Stress BP: 148/69   Exercise Time (min): 7:01  METS: 8.50   Predicted Max HR: 147 bpm  % Max HR: 102.72 bpm  Rate Pressure Product: 22348  Dose of Adenosine (mg): n/a  Dose of Lexiscan: n/a mg   Dose of Atropine (mg): n/a  Dose of Dobutamine: n/a mcg/kg/min (at max HR)   Stress Test Technologist: Ileene Hutchinson, EMT-P  Nuclear Technologist: Charlton Amor, CNMT   Rest Procedure: Myocardial perfusion imaging was performed at rest 45 minutes following the intravenous administration of Technetium 46m Sestamibi.  Rest ECG: Sinus rhythm, anterior MI.  Stress Procedure: The patient exercised on the treadmill utilizing the Bruce Protocol for 7:01 minutes. The patient stopped due to fatigue and denied any chest pain.Pt having frequent PVCs.Technetium 47m Sestamibi was injected at peak exercise and myocardial perfusion imaging was performed after a brief delay.  Stress ECG: No significant ST segment  change suggestive of ischemia.  QPS  Raw Data Images: Acquisition technically good; LVE.  Stress Images: There is decreased uptake in the distal anterior wall, apex and inferior wall.  Rest Images: There is decreased uptake in the distal anterior wall, apex and distal inferior wall.  Subtraction (SDS): No evidence of ischemia.  Transient Ischemic Dilatation (Normal <1.22): 1.08  Lung/Heart Ratio (Normal <0.45): 0.38  Quantitative Gated Spect Images  QGS EDV: 144 ml  QGS ESV: 82 ml  Impression  Exercise Capacity: Fair exercise capacity.  BP Response: Normal blood pressure response.  Clinical Symptoms: No chest pain or dyspnea.  ECG Impression: No significant ST segment change suggestive of ischemia.  Comparison with Prior Nuclear Study: No previous nuclear study performed  Overall Impression: Intermediate risk  stress nuclear study with a large, severe intensity, fixed distal anterior, apical and inferior defect consistent with prior infarct; no ischemia.  LV Ejection Fraction: 43%. LV Wall Motion: Global hypokinesis and apical akinesis.  Kirk Ruths    Lab Results  Component Value Date   WBC 5.1 12/19/2012   HGB 13.8 12/19/2012   HCT 41.9 12/19/2012   PLT 173.0 12/19/2012   GLUCOSE 98 12/19/2012   CHOL 114 12/19/2012   TRIG 58.0 12/19/2012   HDL 44.20 12/19/2012   LDLCALC 58 12/19/2012   ALT 14 12/19/2012   AST 19 12/19/2012   NA 139 12/19/2012   K 4.9 12/19/2012   CL 109 12/19/2012   CREATININE 1.1 12/19/2012   BUN 25* 12/19/2012   CO2 27 12/19/2012   TSH 2.902 Test methodology is 3rd generation TSH 03/10/2009   INR 1.04 01/02/2010   HGBA1C  Value: 5.6 (NOTE) The ADA recommends the following therapeutic goal for glycemic control related to Hgb A1c measurement: Goal of therapy: <6.5 Hgb A1c  Reference: American Diabetes Association: Clinical Practice Recommendations 2010, Diabetes Care, 2010, 33: (Suppl  1). 03/10/2009      Assessment / Plan: 1. Coronary disease with remote anterior  myocardial infarction. Status post DES to the proximal and mid LAD. Later DES placement to the proximal RCA. Patient remains asymptomatic. Myoview study in April showed old scar but no ischemia.  Continue current medical therapy. On long term DAPT.  2. Ischemic cardiomyopathy. EF 43%. Asymptomatic. Continue ACEi and metoprolol.   3. Hypertension-controlled.  4. Hypercholesterolemia-well controlled. Continue statin therapy

## 2014-05-01 NOTE — Patient Instructions (Signed)
Continue your current therapy  We will schedule you for fasting lab work  I will see you in one year

## 2014-05-02 LAB — BASIC METABOLIC PANEL
BUN: 25 mg/dL — ABNORMAL HIGH (ref 6–23)
CALCIUM: 8.7 mg/dL (ref 8.4–10.5)
CHLORIDE: 106 meq/L (ref 96–112)
CO2: 22 mEq/L (ref 19–32)
Creat: 1.03 mg/dL (ref 0.50–1.35)
Glucose, Bld: 90 mg/dL (ref 70–99)
Potassium: 4.3 mEq/L (ref 3.5–5.3)
SODIUM: 140 meq/L (ref 135–145)

## 2014-05-02 LAB — LIPID PANEL
CHOL/HDL RATIO: 2.8 ratio
Cholesterol: 131 mg/dL (ref 0–200)
HDL: 47 mg/dL (ref >39)
LDL Cholesterol: 71 mg/dL (ref 0–99)
Triglycerides: 66 mg/dL (ref <150)
VLDL: 13 mg/dL (ref 0–40)

## 2014-05-02 LAB — HEPATIC FUNCTION PANEL
ALBUMIN: 3.8 g/dL (ref 3.5–5.2)
ALT: 18 U/L (ref 0–53)
AST: 22 U/L (ref 0–37)
Alkaline Phosphatase: 52 U/L (ref 39–117)
Bilirubin, Direct: 0.2 mg/dL (ref 0.0–0.3)
Indirect Bilirubin: 0.6 mg/dL (ref 0.2–1.2)
TOTAL PROTEIN: 6 g/dL (ref 6.0–8.3)
Total Bilirubin: 0.8 mg/dL (ref 0.2–1.2)

## 2014-05-07 ENCOUNTER — Telehealth: Payer: Self-pay | Admitting: Cardiology

## 2014-05-07 NOTE — Telephone Encounter (Signed)
Returning a call concerning his lab results.

## 2014-05-07 NOTE — Telephone Encounter (Signed)
Patient notified of lab results. Voiced understanding

## 2014-09-11 ENCOUNTER — Other Ambulatory Visit: Payer: Self-pay

## 2014-09-11 MED ORDER — CLOPIDOGREL BISULFATE 75 MG PO TABS: 75.0000 mg | ORAL_TABLET | Freq: Once | ORAL | Status: DC

## 2014-11-25 ENCOUNTER — Other Ambulatory Visit: Payer: Self-pay | Admitting: Cardiology

## 2014-12-11 ENCOUNTER — Other Ambulatory Visit: Payer: Self-pay | Admitting: Cardiology

## 2014-12-11 NOTE — Telephone Encounter (Signed)
Rx(s) sent to pharmacy electronically.  

## 2015-01-27 ENCOUNTER — Telehealth: Payer: Self-pay | Admitting: Cardiology

## 2015-01-27 NOTE — Telephone Encounter (Signed)
Close encounter 

## 2015-02-07 ENCOUNTER — Encounter: Payer: Self-pay | Admitting: Cardiology

## 2015-03-14 ENCOUNTER — Telehealth: Payer: Self-pay | Admitting: Cardiology

## 2015-03-14 MED ORDER — CLOPIDOGREL BISULFATE 75 MG PO TABS: 75.0000 mg | ORAL_TABLET | Freq: Once | ORAL | Status: DC

## 2015-03-14 MED ORDER — ATORVASTATIN CALCIUM 40 MG PO TABS: 40.0000 mg | ORAL_TABLET | Freq: Every day | ORAL | Status: DC

## 2015-03-14 MED ORDER — RAMIPRIL 2.5 MG PO CAPS: 2.5000 mg | ORAL_CAPSULE | Freq: Every day | ORAL | Status: DC

## 2015-03-14 MED ORDER — METOPROLOL TARTRATE 25 MG PO TABS: 25.0000 mg | ORAL_TABLET | Freq: Two times a day (BID) | ORAL | Status: DC

## 2015-03-14 NOTE — Telephone Encounter (Signed)
Rx(s) sent to pharmacy electronically. Patient notified. 

## 2015-03-14 NOTE — Telephone Encounter (Signed)
°  1. Which medications need to be refilled? All  2. Which pharmacy is medication to be sent to? [facility]   3. Do they need a 30 day or 90 day supply? He wants a  90 day   4. Would they like a call back once the medication has been sent to the pharmacy? Yes

## 2015-05-12 ENCOUNTER — Ambulatory Visit: Payer: Medicare Other | Admitting: Cardiology

## 2015-05-15 ENCOUNTER — Encounter: Payer: Self-pay | Admitting: Cardiology

## 2015-05-15 ENCOUNTER — Ambulatory Visit (INDEPENDENT_AMBULATORY_CARE_PROVIDER_SITE_OTHER): Payer: 59 | Admitting: Cardiology

## 2015-05-15 VITALS — BP 116/70 | HR 52 | Ht 73.0 in | Wt 218.2 lb

## 2015-05-15 DIAGNOSIS — I255 Ischemic cardiomyopathy: Secondary | ICD-10-CM

## 2015-05-15 DIAGNOSIS — E785 Hyperlipidemia, unspecified: Secondary | ICD-10-CM | POA: Diagnosis not present

## 2015-05-15 DIAGNOSIS — I10 Essential (primary) hypertension: Secondary | ICD-10-CM

## 2015-05-15 DIAGNOSIS — Z23 Encounter for immunization: Secondary | ICD-10-CM

## 2015-05-15 DIAGNOSIS — K4090 Unilateral inguinal hernia, without obstruction or gangrene, not specified as recurrent: Secondary | ICD-10-CM

## 2015-05-15 DIAGNOSIS — I2510 Atherosclerotic heart disease of native coronary artery without angina pectoris: Secondary | ICD-10-CM | POA: Diagnosis not present

## 2015-05-15 MED ORDER — ATORVASTATIN CALCIUM 40 MG PO TABS: 40.0000 mg | ORAL_TABLET | Freq: Every day | ORAL | Status: DC

## 2015-05-15 MED ORDER — RAMIPRIL 2.5 MG PO CAPS: 2.5000 mg | ORAL_CAPSULE | Freq: Every day | ORAL | Status: DC

## 2015-05-15 MED ORDER — CLOPIDOGREL BISULFATE 75 MG PO TABS: 75.0000 mg | ORAL_TABLET | Freq: Once | ORAL | Status: DC

## 2015-05-15 MED ORDER — METOPROLOL TARTRATE 25 MG PO TABS: 25.0000 mg | ORAL_TABLET | Freq: Two times a day (BID) | ORAL | Status: DC

## 2015-05-15 MED ORDER — NITROSTAT 0.4 MG SL SUBL: 0.4000 mg | SUBLINGUAL_TABLET | SUBLINGUAL | Status: DC | PRN

## 2015-05-15 NOTE — Progress Notes (Signed)
Zachary Liu Date of Birth: [birth year]   History of Present Illness: [name] is seen today for followup CAD. He is status post anterior myocardial infarction in September 2010. He had fairly extensive stenting of the proximal and mid LAD. He also had a long stent placed in the proximal right coronary. He had a follow up Myoview study in April 2015 noted below. He states he has been feeling very well. He remains active. He has had no significant chest pain, shortness of breath, or palpitations. He does have a large right inguinal hernia and wants referral to general surgery.    Medication List       This list is accurate as of: 05/15/15 10:21 AM.  Always use your most recent med list.               aspirin 81 MG tablet  Take 81 mg by mouth daily.     atorvastatin 40 MG tablet  Commonly known as:  LIPITOR  Take 1 tablet (40 mg total) by mouth daily.     clopidogrel 75 MG tablet  Commonly known as:  PLAVIX  Take 1 tablet (75 mg total) by mouth once.     metoprolol tartrate 25 MG tablet  Commonly known as:  LOPRESSOR  Take 1 tablet (25 mg total) by mouth 2 (two) times daily.     NITROSTAT 0.4 MG SL tablet  Generic drug:  nitroGLYCERIN  Place 1 tablet (0.4 mg total) under the tongue every 5 (five) minutes as needed for chest pain.     ramipril 2.5 MG capsule  Commonly known as:  ALTACE  Take 1 capsule (2.5 mg total) by mouth daily.        No Known Allergies  Past Medical History  Diagnosis Date  . Hyperlipidemia   . Coronary artery disease   . Myocardial infarction of anterior wall greater than eight weeks ago   . Ischemic cardiomyopathy   . Inguinal hernia   . GI bleed     due to a Mallory Weiss tear  . HTN (hypertension)   . Hypercholesterolemia   . History of anemia     due to gastrointestinal blood loss    Past Surgical History  Procedure Laterality Date  . Esophagogastroduodenoscopy  03/10/2009    Mallory-Weiss tears, one of them was  actively bleeding/these were injected with epinephrine with control of hemorrhage  . Cardiac catheterization  04/18/2009    with stenting/successful intracoronary stenting of the proximal rt coronary artery with a drug-cluting stent    . Cardiac catheterization  03/09/2009    EF30-35%/severe 3-vessel obstructive atherosclerotic coronary artery disease,this is culprit/total occlusion of the proximal LAD/high-grade stenosis in the proximal rt coronary artery/subsequently noted there was some mod to severe stenosis in the mid LAD/successful intracoronary stenting of the proximal&mid LAD using drug-eluting stents/severe lt ventricular dysfunction    History  Smoking status  . Former Smoker  . Quit date: 07/06/1999  Smokeless tobacco  . Not on file    History  Alcohol Use No    Family History  Problem Relation Age of Onset  . Peripheral vascular disease Mother   . Valvular heart disease Brother     Review of Systems: As noted in history of present illness. All other systems were reviewed and are negative.  Physical Exam: BP 116/70 mmHg  Pulse 52  Ht 6\' 1"  (1.854 m)  Wt 98.969 kg (218 lb 3 oz)  BMI 28.79 kg/m2 He is a pleasant  white male in no acute distress. He is normocephalic, atraumatic. Pupils equal round and reactive. Sclera are clear. Neck is supple without JVD, adenopathy, thyromegaly, or bruits. Lungs are clear. Cardiac exam reveals a regular rate and rhythm without gallop, murmur, or click. Abdomen is soft and nontender without masses or bruits. Extremities are without edema. Pulses are 2+ and symmetric. He is alert and oriented x3. Cranial nerves II through XII are intact.  LABORATORY DATA: ECG demonstrates sinus bradycardia with old septal infarct. Rate 51 bpm. No acute change.I have personally reviewed and interpreted this study.  Cardiology Nuclear Med Study  Zachary Liu is a 74 y.o. male MRN : [id number] DOB: 1940-11-30  Procedure Date: 10/04/2013  Nuclear Med  Background  Indication for Stress Test: Evaluation for Ischemia;Stent Patency  History: CAD;MI;(2010) CATH(2010) ;STENT Artery Prox+mid/Rca 10') Echo 2010' EF:35-40%;Cardiomyopathy  Cardiac Risk Factors: History of Smoking, Hypertension and Lipids  Symptoms: no known complaints  Nuclear Pre-Procedure  Caffeine/Decaff Intake: 7:00pm  NPO After: 7:00pm   Lungs: clear  O2 Sat: 97% on room air.  IV 0.9% NS with Angio Cath: 20g   IV Site: R Antecubital  IV Started by: Ileene Hutchinson, EMT-P   Chest Size (in): 46  Cup Size: n/a   Height: 6\' 1"  (1.854 m)  Weight: 213 lb (96.616 kg)   BMI: Body mass index is 28.11 kg/(m^2).  Tech Comments: held lopressor for 24 hrs   Nuclear Med Study  1 or 2 day study: 1 day  Stress Test Type: Stress   Reading MD: n/a  Order Authorizing Provider: Jyaire Koudelka Martinique.MD.   Resting Radionuclide: Technetium 23m Tetrofosmin  Resting Radionuclide Dose: 11.0 mCi   Stress Radionuclide: Technetium 30m Tetrofosmin  Stress Radionuclide Dose: 32.0 mCi   Stress Protocol  Rest HR: 65  Stress HR: 151   Rest BP: 113/78  Stress BP: 148/69   Exercise Time (min): 7:01  METS: 8.50   Predicted Max HR: 147 bpm  % Max HR: 102.72 bpm  Rate Pressure Product: 22348  Dose of Adenosine (mg): n/a  Dose of Lexiscan: n/a mg   Dose of Atropine (mg): n/a  Dose of Dobutamine: n/a mcg/kg/min (at max HR)   Stress Test Technologist: Ileene Hutchinson, EMT-P  Nuclear Technologist: Charlton Amor, CNMT   Rest Procedure: Myocardial perfusion imaging was performed at rest 45 minutes following the intravenous administration of Technetium 33m Sestamibi.  Rest ECG: Sinus rhythm, anterior MI.  Stress Procedure: The patient exercised on the treadmill utilizing the Bruce Protocol for 7:01 minutes. The patient stopped due to fatigue and denied any chest pain.Pt having frequent PVCs.Technetium 30m Sestamibi was injected at peak exercise and myocardial perfusion imaging was performed after a brief delay.  Stress ECG:  No significant ST segment change suggestive of ischemia.  QPS  Raw Data Images: Acquisition technically good; LVE.  Stress Images: There is decreased uptake in the distal anterior wall, apex and inferior wall.  Rest Images: There is decreased uptake in the distal anterior wall, apex and distal inferior wall.  Subtraction (SDS): No evidence of ischemia.  Transient Ischemic Dilatation (Normal <1.22): 1.08  Lung/Heart Ratio (Normal <0.45): 0.38  Quantitative Gated Spect Images  QGS EDV: 144 ml  QGS ESV: 82 ml  Impression  Exercise Capacity: Fair exercise capacity.  BP Response: Normal blood pressure response.  Clinical Symptoms: No chest pain or dyspnea.  ECG Impression: No significant ST segment change suggestive of ischemia.  Comparison with Prior Nuclear Study: No previous nuclear study performed  Overall Impression: Intermediate risk stress nuclear study with a large, severe intensity, fixed distal anterior, apical and inferior defect consistent with prior infarct; no ischemia.  LV Ejection Fraction: 43%. LV Wall Motion: Global hypokinesis and apical akinesis.  Kirk Ruths    Lab Results  Component Value Date   WBC 5.1 12/19/2012   HGB 13.8 12/19/2012   HCT 41.9 12/19/2012   PLT 173.0 12/19/2012   GLUCOSE 90 05/01/2014   CHOL 131 05/01/2014   TRIG 66 05/01/2014   HDL 47 05/01/2014   LDLCALC 71 05/01/2014   ALT 18 05/01/2014   AST 22 05/01/2014   NA 140 05/01/2014   K 4.3 05/01/2014   CL 106 05/01/2014   CREATININE 1.03 05/01/2014   BUN 25* 05/01/2014   CO2 22 05/01/2014   TSH 2.902 Test methodology is 3rd generation TSH 03/10/2009   INR 1.04 01/02/2010   HGBA1C  03/10/2009    5.6 (NOTE) The ADA recommends the following therapeutic goal for glycemic control related to Hgb A1c measurement: Goal of therapy: <6.5 Hgb A1c  Reference: American Diabetes Association: Clinical Practice Recommendations 2010, Diabetes Care, 2010, 33: (Suppl  1).      Assessment / Plan: 1.  Coronary disease with remote anterior myocardial infarction. Status post DES to the proximal and mid LAD. Later DES placement to the proximal RCA. Patient remains asymptomatic. Myoview study in April showed old scar but no ischemia.  Continue current medical therapy. On long term DAPT.  2. Ischemic cardiomyopathy. EF 43%. Asymptomatic. Continue ACEi and metoprolol.   3. Hypertension-controlled.  4. Hypercholesterolemia-well controlled. Continue statin therapy. Will schedule for fasting chemistries and a lipid panel.   5. Right inguinal hernia. Will refer to Dr Harlow Asa who did left inguinal hernia before.

## 2015-05-15 NOTE — Patient Instructions (Signed)
We will get fasting lab work  Continue your current therapy  We will refer you to Dr. Harlow Asa for your hernia.

## 2015-05-20 LAB — BASIC METABOLIC PANEL
BUN: 21 mg/dL (ref 7–25)
CHLORIDE: 105 mmol/L (ref 98–110)
CO2: 29 mmol/L (ref 20–31)
Calcium: 8.9 mg/dL (ref 8.6–10.3)
Creat: 1 mg/dL (ref 0.70–1.18)
Glucose, Bld: 95 mg/dL (ref 65–99)
POTASSIUM: 4.3 mmol/L (ref 3.5–5.3)
SODIUM: 139 mmol/L (ref 135–146)

## 2015-05-20 LAB — HEPATIC FUNCTION PANEL
ALK PHOS: 54 U/L (ref 40–115)
ALT: 15 U/L (ref 9–46)
AST: 20 U/L (ref 10–35)
Albumin: 3.9 g/dL (ref 3.6–5.1)
BILIRUBIN DIRECT: 0.2 mg/dL (ref <=0.2)
BILIRUBIN INDIRECT: 0.6 mg/dL (ref 0.2–1.2)
TOTAL PROTEIN: 6.2 g/dL (ref 6.1–8.1)
Total Bilirubin: 0.8 mg/dL (ref 0.2–1.2)

## 2015-05-20 LAB — LIPID PANEL
CHOL/HDL RATIO: 2.7 ratio (ref <=5.0)
CHOLESTEROL: 121 mg/dL — AB (ref 125–200)
HDL: 45 mg/dL (ref >=40)
LDL Cholesterol: 62 mg/dL (ref <130)
TRIGLYCERIDES: 72 mg/dL (ref <150)
VLDL: 14 mg/dL (ref <30)

## 2015-05-21 ENCOUNTER — Ambulatory Visit: Payer: Self-pay | Admitting: General Surgery

## 2015-05-21 NOTE — H&P (Signed)
History of Present Illness Ralene Ok MD; 05/21/2015 10:05 AM) Patient words: Evaluate inguinal hernia.  The patient is a 74 year old male who presents with an inguinal hernia. The patient is a 74 year old male who is referred by Dr. Peter Martinique for evaluation of a right inguinal hernia. The patient states there is been there for approximately 1 year. He states his gotten bigger and becoming more painful recently. He said this has limited his activities. She does wear a truss to help with the pain.  The patient previously had a MI in the past as well as heart stents. The patient is currently on Plavix.   Other Problems Ivor Costa, [place]; 05/21/2015 9:47 AM) Inguinal Hernia Myocardial infarction  Past Surgical History Ivor Costa, CMA; 05/21/2015 9:47 AM) Open Inguinal Hernia Surgery Left.  Diagnostic Studies History Ivor Costa, [place]; 05/21/2015 9:47 AM) Colonoscopy never  Allergies Ivor Costa, CMA; 05/21/2015 9:47 AM) No Known Allergies11/16/2016 No Known Drug Allergies11/16/2016  Medication History Ivor Costa, CMA; 05/21/2015 9:47 AM) Atorvastatin Calcium (40MG  Tablet, Oral) Active. Metoprolol Tartrate (25MG  Tablet, Oral) Active. Clopidogrel Bisulfate (75MG  Tablet, Oral) Active. Ramipril (2.5MG  Capsule, Oral) Active. Nitrostat (0.4MG  Tab Sublingual, Sublingual) Active.  Social History Ivor Costa, [place]; 05/21/2015 9:47 AM) Alcohol use Occasional alcohol use. No caffeine use No drug use Tobacco use Former smoker.  Family History Ivor Costa, [place]; 05/21/2015 9:47 AM) Cerebrovascular Accident Mother. Colon Cancer Sister.    Review of Systems Ralene Ok MD; 05/21/2015 10:05 AM) General Not Present- Appetite Loss, Chills, Fatigue, Fever, Night Sweats, Weight Gain and Weight Loss. Skin Not Present- Change in Wart/Mole, Dryness, Hives, Jaundice, New Lesions, Non-Healing Wounds, Rash and Ulcer. HEENT Not Present- Earache,  Hearing Loss, Hoarseness, Nose Bleed, Oral Ulcers, Ringing in the Ears, Seasonal Allergies, Sinus Pain, Sore Throat, Visual Disturbances, Wears glasses/contact lenses and Yellow Eyes. Respiratory Not Present- Bloody sputum, Chronic Cough, Difficulty Breathing, Snoring and Wheezing. Breast Not Present- Breast Mass, Breast Pain, Nipple Discharge and Skin Changes. Cardiovascular Not Present- Chest Pain, Difficulty Breathing Lying Down, Leg Cramps, Palpitations, Rapid Heart Rate, Shortness of Breath and Swelling of Extremities. Gastrointestinal Present- Abdominal Pain. Male Genitourinary Present- Frequency. Not Present- Blood in Urine, Change in Urinary Stream, Impotence, Nocturia, Painful Urination, Urgency and Urine Leakage. Musculoskeletal Not Present- Back Pain, Joint Pain, Joint Stiffness, Muscle Pain, Muscle Weakness and Swelling of Extremities. Neurological Not Present- Decreased Memory, Fainting, Headaches, Numbness, Seizures, Tingling, Tremor, Trouble walking and Weakness. Psychiatric Not Present- Anxiety, Bipolar, Change in Sleep Pattern, Depression, Fearful and Frequent crying. Endocrine Not Present- Cold Intolerance, Excessive Hunger, Hair Changes, Heat Intolerance, Hot flashes and New Diabetes. Hematology Not Present- Easy Bruising, Excessive bleeding, Gland problems, HIV and Persistent Infections.  Vitals Ivor Costa CMA; 05/21/2015 9:47 AM) 05/21/2015 9:46 AM Weight: 213.4 lb Height: 73in Body Surface Area: 2.21 m Body Mass Index: 28.15 kg/m  Temp.: 97.9F(Temporal)  Pulse: 72 (Regular)  Resp.: 18 (Unlabored)  BP: 132/84 (Sitting, Left Arm, Standard)       Physical Exam Ralene Ok MD; 05/21/2015 10:06 AM) General Mental Status-Alert. General Appearance-Consistent with stated age. Hydration-Well hydrated. Voice-Normal.  Head and Neck Head-normocephalic, atraumatic with no lesions or palpable masses. Trachea-midline.  Eye Eyeball -  Bilateral-Extraocular movements intact. Sclera/Conjunctiva - Bilateral-No scleral icterus.  Chest and Lung Exam Chest and lung exam reveals -quiet, even and easy respiratory effort with no use of accessory muscles. Inspection Chest Wall - Normal. Back - normal.  Cardiovascular Cardiovascular examination reveals -normal heart sounds, regular rate and rhythm with no  murmurs.  Abdomen Inspection Skin - Scar - no surgical scars. Hernias - Inguinal hernia - Right - Reducible(large). Palpation/Percussion Normal exam - Soft, Non Tender, No Rebound tenderness, No Rigidity (guarding) and No hepatosplenomegaly. Auscultation Normal exam - Bowel sounds normal.  Neurologic Neurologic evaluation reveals -alert and oriented x 3 with no impairment of recent or remote memory. Mental Status-Normal.  Musculoskeletal Normal Exam - Left-Upper Extremity Strength Normal and Lower Extremity Strength Normal. Normal Exam - Right-Upper Extremity Strength Normal, Lower Extremity Weakness.    Assessment & Plan Ralene Ok MD; 05/21/2015 10:07 AM) RIGHT INGUINAL HERNIA (K40.90) Impression: 74 year old male with a large right inguinal hernia.  1. The patient will have to be off of Plavix for 7 days we will send Dr. Martinique a clearance 2. 1. The patient will like to proceed to the operating room for open right inguinal hernia repair. 3. I discussed with the patient the signs and symptoms of incarceration and strangulation and the need to proceed to the ER should they occur. 4. I discussed with the patient the risks and benefits of the procedure to include but not limited to: Infection, bleeding, damage to surrounding structures, possible need for further surgery, possible nerve pain, and possible recurrence. The patient was understanding and wishes to proceed.

## 2015-05-22 ENCOUNTER — Telehealth: Payer: Self-pay | Admitting: Cardiology

## 2015-05-22 NOTE — Telephone Encounter (Signed)
Pt is returning Cheryl's call from yesterday about his lab results. Please f/u with him

## 2015-05-22 NOTE — Telephone Encounter (Signed)
no answer

## 2015-05-23 NOTE — Telephone Encounter (Signed)
Returned call to patient lab results given. 

## 2015-06-04 ENCOUNTER — Encounter (HOSPITAL_COMMUNITY): Payer: Self-pay | Admitting: *Deleted

## 2015-06-04 MED ORDER — CHLORHEXIDINE GLUCONATE 4 % EX LIQD: 1.0000 "application " | Freq: Once | CUTANEOUS | Status: DC

## 2015-06-04 MED ORDER — CEFAZOLIN SODIUM-DEXTROSE 2-3 GM-% IV SOLR
2.0000 g | INTRAVENOUS | Status: AC
  Administered 2015-06-05: 2 g via INTRAVENOUS
  Filled 2015-06-04: qty 50

## 2015-06-04 NOTE — Progress Notes (Signed)
Anesthesia Chart Review:  Pt is 74 year old male scheduled for open R inguinal hernia repair with mesh on 06/05/2015 with Dr. Rosendo Gros.   Cardiologist is Dr. Peter Martinique.   Pt is a same day work up.   PMH includes:  CAD (2010: extensive stenting in proximal and mid LAD, long stent to proximal RCA), MI, HTN, hyperlipidemia, ischemic cardiomyopathy. Former smoker. BMI 29.   Medications include: ASA, lipitor, plavix, metoprolol, ramipril. Pt stopped plavix 05/28/15.   Pt will need labs DOS.   EKG 05/15/15: sinus bradycardia (51 bpm). Septal infarct, age undetermined.   Nuclear stress test 10/04/13: Intermediate risk stress nuclear study with a large, severe intensity, fixed distal anterior, apical and inferior defect consistent with prior infarct; no ischemia. LV Ejection Fraction: 43%. LV Wall Motion: Global hypokinesis and apical akinesis.  Echo 03/10/09:  1. Left ventricle: The cavity size was normal. Wall thickness was normal. Systolic function was moderately reduced. The estimated ejection fraction was in the range of 35% to 40%. Akinesis of the mid-distal anterior myocardium. Akinesis of the apical myocardium. Doppler parameters are consistent with abnormal left ventricular relaxation (grade 1 diastolic dysfunction). 2. Left atrium: The atrium was mildly dilated.  Dr. Martinique is aware of upcoming surgery.   If labs acceptable DOS, I anticipate pt can proceed as scheduled.   Willeen Cass, FNP-BC [facility]/Anesthesiology Phone: [phone] 06/04/2015 4:58 PM

## 2015-06-04 NOTE — Progress Notes (Signed)
Pt does have cardiac history, is followed by Dr. Martinique. Last visit was 05/15/15 and Dr. Martinique referred him to Dr. Rosendo Gros. No cardiac clearance noted in EPIC, though there is a noted from Dr. Rosendo Gros that he would be sending a clearance request to Dr. Martinique. I called and spoke with Coralyn Mark at Dr. Rosendo Gros' office to see if they have the clearance. She states she will look into finding it.  Pt states he was instructed by Dr. Martinique to stop his Plavix a week ago (last dose 05/28/15) but to continue his Aspirin.  Stress test - 10/04/13 in EPIC  Echo - 2010 in [place].   EKG - 05/15/15 in EPIC.

## 2015-06-05 ENCOUNTER — Encounter (HOSPITAL_COMMUNITY)
Admission: RE | Disposition: A | Discharge status: Home / Self Care | Payer: Self-pay | Source: Ambulatory Visit | Attending: General Surgery

## 2015-06-05 ENCOUNTER — Ambulatory Visit (HOSPITAL_COMMUNITY)
Admission: RE | Admit: 2015-06-05 | Discharge: 2015-06-05 | Disposition: A | Discharge status: Home / Self Care | Payer: 59 | Source: Ambulatory Visit | Attending: General Surgery | Admitting: General Surgery

## 2015-06-05 ENCOUNTER — Encounter (HOSPITAL_COMMUNITY): Payer: Self-pay

## 2015-06-05 ENCOUNTER — Ambulatory Visit (HOSPITAL_COMMUNITY): Payer: 59 | Admitting: Emergency Medicine

## 2015-06-05 DIAGNOSIS — E785 Hyperlipidemia, unspecified: Secondary | ICD-10-CM | POA: Insufficient documentation

## 2015-06-05 DIAGNOSIS — Z7902 Long term (current) use of antithrombotics/antiplatelets: Secondary | ICD-10-CM | POA: Insufficient documentation

## 2015-06-05 DIAGNOSIS — K4090 Unilateral inguinal hernia, without obstruction or gangrene, not specified as recurrent: Secondary | ICD-10-CM | POA: Diagnosis not present

## 2015-06-05 DIAGNOSIS — I10 Essential (primary) hypertension: Secondary | ICD-10-CM | POA: Diagnosis not present

## 2015-06-05 DIAGNOSIS — Z87891 Personal history of nicotine dependence: Secondary | ICD-10-CM | POA: Diagnosis not present

## 2015-06-05 DIAGNOSIS — I252 Old myocardial infarction: Secondary | ICD-10-CM | POA: Insufficient documentation

## 2015-06-05 DIAGNOSIS — I255 Ischemic cardiomyopathy: Secondary | ICD-10-CM | POA: Insufficient documentation

## 2015-06-05 HISTORY — PX: INSERTION OF MESH: SHX5868

## 2015-06-05 HISTORY — PX: INGUINAL HERNIA REPAIR: SHX194

## 2015-06-05 LAB — BASIC METABOLIC PANEL
ANION GAP: 9 (ref 5–15)
BUN: 15 mg/dL (ref 6–20)
CO2: 22 mmol/L (ref 22–32)
CREATININE: 1.09 mg/dL (ref 0.61–1.24)
Calcium: 8.8 mg/dL — ABNORMAL LOW (ref 8.9–10.3)
Chloride: 109 mmol/L (ref 101–111)
Glucose, Bld: 96 mg/dL (ref 65–99)
Potassium: 4.1 mmol/L (ref 3.5–5.1)
SODIUM: 140 mmol/L (ref 135–145)

## 2015-06-05 LAB — CBC
HCT: 43.4 % (ref 39.0–52.0)
HEMOGLOBIN: 14.1 g/dL (ref 13.0–17.0)
MCH: 29.9 pg (ref 26.0–34.0)
MCHC: 32.5 g/dL (ref 30.0–36.0)
MCV: 91.9 fL (ref 78.0–100.0)
PLATELETS: 188 10*3/uL (ref 150–400)
RBC: 4.72 MIL/uL (ref 4.22–5.81)
RDW: 14 % (ref 11.5–15.5)
WBC: 6.7 10*3/uL (ref 4.0–10.5)

## 2015-06-05 SURGERY — REPAIR, HERNIA, INGUINAL, ADULT
Anesthesia: General | Site: Groin | Laterality: Right

## 2015-06-05 MED ORDER — OXYCODONE HCL 5 MG PO TABS: 5.0000 mg | ORAL_TABLET | ORAL | Status: DC | PRN

## 2015-06-05 MED ORDER — EPHEDRINE SULFATE 50 MG/ML IJ SOLN
INTRAMUSCULAR | Status: DC | PRN
  Administered 2015-06-05 (×2): 5 mg via INTRAVENOUS

## 2015-06-05 MED ORDER — ONDANSETRON HCL 4 MG/2ML IJ SOLN
INTRAMUSCULAR | Status: DC | PRN
  Administered 2015-06-05: 4 mg via INTRAVENOUS

## 2015-06-05 MED ORDER — ACETAMINOPHEN 650 MG RE SUPP: 650.0000 mg | RECTAL | Status: DC | PRN

## 2015-06-05 MED ORDER — FENTANYL CITRATE (PF) 100 MCG/2ML IJ SOLN
INTRAMUSCULAR | Status: AC
  Filled 2015-06-05: qty 2

## 2015-06-05 MED ORDER — FENTANYL CITRATE (PF) 250 MCG/5ML IJ SOLN
INTRAMUSCULAR | Status: AC
  Filled 2015-06-05: qty 5

## 2015-06-05 MED ORDER — 0.9 % SODIUM CHLORIDE (POUR BTL) OPTIME
TOPICAL | Status: DC | PRN
  Administered 2015-06-05: 1000 mL

## 2015-06-05 MED ORDER — SODIUM CHLORIDE 0.9 % IJ SOLN: 3.0000 mL | INTRAMUSCULAR | Status: DC | PRN

## 2015-06-05 MED ORDER — PROPOFOL 10 MG/ML IV BOLUS
INTRAVENOUS | Status: AC
  Filled 2015-06-05: qty 20

## 2015-06-05 MED ORDER — FENTANYL CITRATE (PF) 100 MCG/2ML IJ SOLN
25.0000 ug | INTRAMUSCULAR | Status: DC | PRN
  Administered 2015-06-05: 25 ug via INTRAVENOUS
  Administered 2015-06-05: 50 ug via INTRAVENOUS
  Administered 2015-06-05: 25 ug via INTRAVENOUS

## 2015-06-05 MED ORDER — LACTATED RINGERS IV SOLN
INTRAVENOUS | Status: DC
  Administered 2015-06-05: 08:00:00 via INTRAVENOUS

## 2015-06-05 MED ORDER — PROPOFOL 10 MG/ML IV BOLUS
INTRAVENOUS | Status: DC | PRN
  Administered 2015-06-05: 170 mg via INTRAVENOUS

## 2015-06-05 MED ORDER — OXYCODONE HCL 5 MG PO TABS
ORAL_TABLET | ORAL | Status: AC
  Filled 2015-06-05: qty 1

## 2015-06-05 MED ORDER — MIDAZOLAM HCL 2 MG/2ML IJ SOLN
INTRAMUSCULAR | Status: AC
  Filled 2015-06-05: qty 2

## 2015-06-05 MED ORDER — BUPIVACAINE-EPINEPHRINE (PF) 0.25% -1:200000 IJ SOLN
INTRAMUSCULAR | Status: AC
  Filled 2015-06-05: qty 30

## 2015-06-05 MED ORDER — OXYCODONE HCL 5 MG/5ML PO SOLN
5.0000 mg | Freq: Once | ORAL | Status: AC | PRN
Start: 2015-06-05 — End: 2015-06-05

## 2015-06-05 MED ORDER — SUGAMMADEX SODIUM 200 MG/2ML IV SOLN
INTRAVENOUS | Status: AC
  Filled 2015-06-05: qty 2

## 2015-06-05 MED ORDER — OXYCODONE-ACETAMINOPHEN 5-325 MG PO TABS: 1.0000 | ORAL_TABLET | ORAL | Status: DC | PRN

## 2015-06-05 MED ORDER — MORPHINE SULFATE (PF) 2 MG/ML IV SOLN: 2.0000 mg | INTRAVENOUS | Status: DC | PRN

## 2015-06-05 MED ORDER — BUPIVACAINE-EPINEPHRINE 0.25% -1:200000 IJ SOLN
INTRAMUSCULAR | Status: DC | PRN
  Administered 2015-06-05: 30 mL

## 2015-06-05 MED ORDER — ROCURONIUM BROMIDE 100 MG/10ML IV SOLN
INTRAVENOUS | Status: DC | PRN
  Administered 2015-06-05: 50 mg via INTRAVENOUS

## 2015-06-05 MED ORDER — ACETAMINOPHEN 325 MG PO TABS: 650.0000 mg | ORAL_TABLET | ORAL | Status: DC | PRN

## 2015-06-05 MED ORDER — SUGAMMADEX SODIUM 200 MG/2ML IV SOLN
INTRAVENOUS | Status: DC | PRN
  Administered 2015-06-05: 200 mg via INTRAVENOUS

## 2015-06-05 MED ORDER — ONDANSETRON HCL 4 MG/2ML IJ SOLN: 4.0000 mg | Freq: Once | INTRAMUSCULAR | Status: DC | PRN

## 2015-06-05 MED ORDER — FENTANYL CITRATE (PF) 100 MCG/2ML IJ SOLN
INTRAMUSCULAR | Status: DC | PRN
  Administered 2015-06-05: 100 ug via INTRAVENOUS
  Administered 2015-06-05: 50 ug via INTRAVENOUS

## 2015-06-05 MED ORDER — MIDAZOLAM HCL 5 MG/5ML IJ SOLN
INTRAMUSCULAR | Status: DC | PRN
  Administered 2015-06-05: 1 mg via INTRAVENOUS

## 2015-06-05 MED ORDER — GLYCOPYRROLATE 0.2 MG/ML IJ SOLN
INTRAMUSCULAR | Status: DC | PRN
  Administered 2015-06-05: 0.2 mg via INTRAVENOUS

## 2015-06-05 MED ORDER — SODIUM CHLORIDE 0.9 % IJ SOLN: 3.0000 mL | Freq: Two times a day (BID) | INTRAMUSCULAR | Status: DC

## 2015-06-05 MED ORDER — SODIUM CHLORIDE 0.9 % IV SOLN
250.0000 mL | INTRAVENOUS | Status: DC | PRN
  Administered 2015-06-05: 1000 mL via INTRAVENOUS

## 2015-06-05 MED ORDER — LIDOCAINE HCL (CARDIAC) 20 MG/ML IV SOLN
INTRAVENOUS | Status: DC | PRN
  Administered 2015-06-05: 4 mg via INTRAVENOUS

## 2015-06-05 MED ORDER — OXYCODONE HCL 5 MG PO TABS
5.0000 mg | ORAL_TABLET | Freq: Once | ORAL | Status: AC | PRN
  Administered 2015-06-05: 5 mg via ORAL

## 2015-06-05 SURGICAL SUPPLY — 55 items
BLADE SURG 10 STRL SS (BLADE) ×3 IMPLANT
BLADE SURG 15 STRL LF DISP TIS (BLADE) ×1 IMPLANT
BLADE SURG 15 STRL SS (BLADE) ×2
BLADE SURG ROTATE 9660 (MISCELLANEOUS) IMPLANT
CANISTER SUCTION 2500CC (MISCELLANEOUS) IMPLANT
CHLORAPREP W/TINT 26ML (MISCELLANEOUS) ×3 IMPLANT
COVER SURGICAL LIGHT HANDLE (MISCELLANEOUS) ×3 IMPLANT
DRAIN PENROSE 1/2X12 LTX STRL (WOUND CARE) IMPLANT
DRAPE LAPAROTOMY TRNSV 102X78 (DRAPE) ×3 IMPLANT
DRAPE UTILITY XL STRL (DRAPES) ×3 IMPLANT
ELECT CAUTERY BLADE 6.4 (BLADE) ×3 IMPLANT
ELECT REM PT RETURN 9FT ADLT (ELECTROSURGICAL) ×3
ELECTRODE REM PT RTRN 9FT ADLT (ELECTROSURGICAL) ×1 IMPLANT
GAUZE SPONGE 4X4 16PLY XRAY LF (GAUZE/BANDAGES/DRESSINGS) ×3 IMPLANT
GLOVE BIO SURGEON STRL SZ7.5 (GLOVE) ×3 IMPLANT
GLOVE BIO SURGEON STRL SZ8.5 (GLOVE) ×3 IMPLANT
GLOVE BIOGEL PI IND STRL 8 (GLOVE) ×1 IMPLANT
GLOVE BIOGEL PI IND STRL 8.5 (GLOVE) ×1 IMPLANT
GLOVE BIOGEL PI INDICATOR 8 (GLOVE) ×2
GLOVE BIOGEL PI INDICATOR 8.5 (GLOVE) ×2
GLOVE SURG SS PI 7.5 STRL IVOR (GLOVE) ×3 IMPLANT
GOWN STRL REUS W/ TWL LRG LVL3 (GOWN DISPOSABLE) ×1 IMPLANT
GOWN STRL REUS W/ TWL XL LVL3 (GOWN DISPOSABLE) ×1 IMPLANT
GOWN STRL REUS W/TWL LRG LVL3 (GOWN DISPOSABLE) ×2
GOWN STRL REUS W/TWL XL LVL3 (GOWN DISPOSABLE) ×2
KIT BASIN OR (CUSTOM PROCEDURE TRAY) ×3 IMPLANT
KIT ROOM TURNOVER OR (KITS) ×3 IMPLANT
LIQUID BAND (GAUZE/BANDAGES/DRESSINGS) ×3 IMPLANT
MESH PARIETEX PROGRIP RIGHT (Mesh General) ×3 IMPLANT
NEEDLE HYPO 25GX1X1/2 BEV (NEEDLE) ×3 IMPLANT
NS IRRIG 1000ML POUR BTL (IV SOLUTION) ×3 IMPLANT
PACK SURGICAL SETUP 50X90 (CUSTOM PROCEDURE TRAY) ×3 IMPLANT
PAD ARMBOARD 7.5X6 YLW CONV (MISCELLANEOUS) ×6 IMPLANT
PENCIL BUTTON HOLSTER BLD 10FT (ELECTRODE) ×3 IMPLANT
SPECIMEN JAR SMALL (MISCELLANEOUS) ×3 IMPLANT
SPONGE INTESTINAL PEANUT (DISPOSABLE) IMPLANT
SPONGE LAP 18X18 X RAY DECT (DISPOSABLE) ×3 IMPLANT
SUCTION POOLE TIP (SUCTIONS) ×3 IMPLANT
SUT MNCRL AB 4-0 PS2 18 (SUTURE) ×3 IMPLANT
SUT PDS AB 0 CT 36 (SUTURE) IMPLANT
SUT PROLENE 2 0 SH DA (SUTURE) ×3 IMPLANT
SUT VIC AB 0 CT2 27 (SUTURE) ×3 IMPLANT
SUT VIC AB 2-0 SH 27 (SUTURE) ×2
SUT VIC AB 2-0 SH 27X BRD (SUTURE) ×1 IMPLANT
SUT VIC AB 3-0 SH 27 (SUTURE) ×6
SUT VIC AB 3-0 SH 27XBRD (SUTURE) ×3 IMPLANT
SUT VICRYL AB 2 0 TIES (SUTURE) ×3 IMPLANT
SYR BULB 3OZ (MISCELLANEOUS) ×3 IMPLANT
SYR CONTROL 10ML LL (SYRINGE) ×3 IMPLANT
TOWEL OR 17X24 6PK STRL BLUE (TOWEL DISPOSABLE) ×3 IMPLANT
TOWEL OR 17X26 10 PK STRL BLUE (TOWEL DISPOSABLE) ×3 IMPLANT
TRAY FOLEY CATH 16FR SILVER (SET/KITS/TRAYS/PACK) IMPLANT
TUBE CONNECTING 12'X1/4 (SUCTIONS) ×1
TUBE CONNECTING 12X1/4 (SUCTIONS) ×2 IMPLANT
YANKAUER SUCT BULB TIP NO VENT (SUCTIONS) ×3 IMPLANT

## 2015-06-05 NOTE — Transfer of Care (Signed)
Immediate Anesthesia Transfer of Care Note  Patient: Zachary Liu  Procedure(s) Performed: Procedure(s): OPEN RIGHT INGUINAL HERNIA REPAIR WITH MESH (Right) INSERTION OF MESH (Right)  Patient Location: PACU  Anesthesia Type:General  Level of Consciousness: awake, alert , oriented and patient cooperative  Airway & Oxygen Therapy: Patient Spontanous Breathing and Patient connected to nasal cannula oxygen  Post-op Assessment: Report given to RN, Post -op Vital signs reviewed and stable, Patient moving all extremities, Patient moving all extremities X 4 and Patient able to stick tongue midline  Post vital signs: Reviewed and stable  Last Vitals:  Filed Vitals:   06/05/15 0703  BP: 122/74  Pulse: 51  Temp: 36.8 C  Resp: 18    Complications: No apparent anesthesia complications

## 2015-06-05 NOTE — Anesthesia Procedure Notes (Signed)
Procedure Name: Intubation Date/Time: 06/05/2015 8:51 AM Performed by: Shirlyn Goltz Pre-anesthesia Checklist: Patient identified, Emergency Drugs available, Suction available and Patient being monitored Patient Re-evaluated:Patient Re-evaluated prior to inductionOxygen Delivery Method: Circle system utilized Preoxygenation: Pre-oxygenation with 100% oxygen Intubation Type: IV induction Ventilation: Mask ventilation without difficulty and Oral airway inserted - appropriate to patient size Laryngoscope Size: Mac and 4 Grade View: Grade I Tube type: Oral Tube size: 7.5 mm Number of attempts: 1 Airway Equipment and Method: Stylet Placement Confirmation: ETT inserted through vocal cords under direct vision,  positive ETCO2 and breath sounds checked- equal and bilateral Secured at: 21 cm Tube secured with: Tape Dental Injury: Teeth and Oropharynx as per pre-operative assessment

## 2015-06-05 NOTE — Op Note (Signed)
06/05/2015  9:42 AM  PATIENT:  Zachary Liu  74 y.o. male  PRE-OPERATIVE DIAGNOSIS:  RIGHT INGUINAL HERNIA  POST-OPERATIVE DIAGNOSIS:  RIGHT PANTALOON INGUINAL HERNIA  PROCEDURE:  Procedure(s): OPEN RIGHT INGUINAL HERNIA REPAIR WITH MESH (Right) INSERTION OF MESH (Right)  SURGEON:  Surgeon(s) and Role:    * Ralene Ok, MD - Primary  ANESTHESIA:   local, regional and general  EBL:   10CC  BLOOD ADMINISTERED:none  DRAINS: none   LOCAL MEDICATIONS USED:  BUPIVICAINE   SPECIMEN:  Source of Specimen:  HERNIA SAC  DISPOSITION OF SPECIMEN:  PATHOLOGY  COUNTS:  YES  TOURNIQUET:  * No tourniquets in log *  DICTATION: .Dragon Dictation Details of the procedure: The patient was taken back to the operating room. The patient was placed in supine position with bilateral SCDs in place. The patient was prepped and draped in the usual sterile fashion.  After appropriate anitbiotics were confirmed, a time-out was confirmed and all facts were verified.  Quarter percent Marcaine was used to infiltrate the area of the incision and an ilioinguinal nerve block was also placed.   A 5 cm incision was made just 1 cm superior to the inguinal ligament. Bovie cautery was used to maintain hemostasis dissection is carried down to the external oblique.  A standard incision was made laterally, and the external oblique was bluntly dissected away from the surrounding tissue with Metzenbaum scissors. There was a large amount of scar tissue.The external oblique was elevated in the spermatic cord was bluntly dissected away from the surrounding tissue.  The ilioinguinal nerve was identified and ligated with an 2-0 dyed vicryl.   The spermatic cord and the hernia were then bluntly dissected away from the pubic tubercle and a Penrose was placed around the hernia sac in the spermatic cord. The vas deferens was identified and protected at all portions of the case. Dissection of the cremasterics took place  with Bovie cautery. Once the hernia sac was dissected away from the surrrounding cremesteric tissue , the hernia sac was entered laterally. There was no sliding components and no femoral hernia palpated. The hernia sac was highly ligated using 0 Vicryl.  The specimen was sent to pathology. This retracted into the abdomen in the usual fashion. There appeared to be a small direct defect as well. At this time a left sided Progrip mesh was then anchored to the pubic tubercle with a 2-0 Prolene.  This mesh overlapped the direct defect by approximately 2cm.  It was anchored to the shelving edge of the external oblique x 1 and the conjoint tendon cephalad x 1.  The wrap around of the mesh was sutured to the conjoint tendon as well.  The new internal ring did not strangulate the spermatic cord.   The tail was then tucked under the external oblique. At this time the area was irrigated out with sterile saline.    The external oblique was reapproximated using a 2-0 Vicryl in a running fashion. Scarpa's fascia was then reapproximated using a 3-0 Vicryl running fashion. The skin was then reapproximated with 4 Monocryl in a subcuticular fashion. The skin was then dressed with Dermabond.  The patient was taken to the recovery room in stable condition.   PLAN OF CARE: Discharge to home after PACU  PATIENT DISPOSITION:  PACU - hemodynamically stable.   Delay start of Pharmacological VTE agent (>24hrs) due to surgical blood loss or risk of bleeding: not applicable

## 2015-06-05 NOTE — Interval H&P Note (Signed)
History and Physical Interval Note:  06/05/2015 7:27 AM  Zachary Liu  has presented today for surgery, with the diagnosis of RIGHT INGUINAL HERNIA  The various methods of treatment have been discussed with the patient and family. After consideration of risks, benefits and other options for treatment, the patient has consented to  Procedure(s): OPEN RIGHT INGUINAL HERNIA REPAIR WITH MESH (Right) INSERTION OF MESH (Right) as a surgical intervention .  The patient's history has been reviewed, patient examined, no change in status, stable for surgery.  I have reviewed the patient's chart and labs.  Questions were answered to the patient's satisfaction.     Rosario Jacks., Anne Hahn

## 2015-06-05 NOTE — H&P (View-Only) (Signed)
History of Present Illness Ralene Ok MD; 05/21/2015 10:05 AM) Patient words: Evaluate inguinal hernia.  The patient is a 74 year old male who presents with an inguinal hernia. The patient is a 74 year old male who is referred by Dr. Peter Martinique for evaluation of a right inguinal hernia. The patient states there is been there for approximately 1 year. He states his gotten bigger and becoming more painful recently. He said this has limited his activities. She does wear a truss to help with the pain.  The patient previously had a MI in the past as well as heart stents. The patient is currently on Plavix.   Other Problems Ivor Costa, [place]; 05/21/2015 9:47 AM) Inguinal Hernia Myocardial infarction  Past Surgical History Ivor Costa, CMA; 05/21/2015 9:47 AM) Open Inguinal Hernia Surgery Left.  Diagnostic Studies History Ivor Costa, [place]; 05/21/2015 9:47 AM) Colonoscopy never  Allergies Ivor Costa, CMA; 05/21/2015 9:47 AM) No Known Allergies11/16/2016 No Known Drug Allergies11/16/2016  Medication History Ivor Costa, CMA; 05/21/2015 9:47 AM) Atorvastatin Calcium (40MG  Tablet, Oral) Active. Metoprolol Tartrate (25MG  Tablet, Oral) Active. Clopidogrel Bisulfate (75MG  Tablet, Oral) Active. Ramipril (2.5MG  Capsule, Oral) Active. Nitrostat (0.4MG  Tab Sublingual, Sublingual) Active.  Social History Ivor Costa, [place]; 05/21/2015 9:47 AM) Alcohol use Occasional alcohol use. No caffeine use No drug use Tobacco use Former smoker.  Family History Ivor Costa, [place]; 05/21/2015 9:47 AM) Cerebrovascular Accident Mother. Colon Cancer Sister.    Review of Systems Ralene Ok MD; 05/21/2015 10:05 AM) General Not Present- Appetite Loss, Chills, Fatigue, Fever, Night Sweats, Weight Gain and Weight Loss. Skin Not Present- Change in Wart/Mole, Dryness, Hives, Jaundice, New Lesions, Non-Healing Wounds, Rash and Ulcer. HEENT Not Present- Earache,  Hearing Loss, Hoarseness, Nose Bleed, Oral Ulcers, Ringing in the Ears, Seasonal Allergies, Sinus Pain, Sore Throat, Visual Disturbances, Wears glasses/contact lenses and Yellow Eyes. Respiratory Not Present- Bloody sputum, Chronic Cough, Difficulty Breathing, Snoring and Wheezing. Breast Not Present- Breast Mass, Breast Pain, Nipple Discharge and Skin Changes. Cardiovascular Not Present- Chest Pain, Difficulty Breathing Lying Down, Leg Cramps, Palpitations, Rapid Heart Rate, Shortness of Breath and Swelling of Extremities. Gastrointestinal Present- Abdominal Pain. Male Genitourinary Present- Frequency. Not Present- Blood in Urine, Change in Urinary Stream, Impotence, Nocturia, Painful Urination, Urgency and Urine Leakage. Musculoskeletal Not Present- Back Pain, Joint Pain, Joint Stiffness, Muscle Pain, Muscle Weakness and Swelling of Extremities. Neurological Not Present- Decreased Memory, Fainting, Headaches, Numbness, Seizures, Tingling, Tremor, Trouble walking and Weakness. Psychiatric Not Present- Anxiety, Bipolar, Change in Sleep Pattern, Depression, Fearful and Frequent crying. Endocrine Not Present- Cold Intolerance, Excessive Hunger, Hair Changes, Heat Intolerance, Hot flashes and New Diabetes. Hematology Not Present- Easy Bruising, Excessive bleeding, Gland problems, HIV and Persistent Infections.  Vitals Ivor Costa CMA; 05/21/2015 9:47 AM) 05/21/2015 9:46 AM Weight: 213.4 lb Height: 73in Body Surface Area: 2.21 m Body Mass Index: 28.15 kg/m  Temp.: 97.74F(Temporal)  Pulse: 72 (Regular)  Resp.: 18 (Unlabored)  BP: 132/84 (Sitting, Left Arm, Standard)       Physical Exam Ralene Ok MD; 05/21/2015 10:06 AM) General Mental Status-Alert. General Appearance-Consistent with stated age. Hydration-Well hydrated. Voice-Normal.  Head and Neck Head-normocephalic, atraumatic with no lesions or palpable masses. Trachea-midline.  Eye Eyeball -  Bilateral-Extraocular movements intact. Sclera/Conjunctiva - Bilateral-No scleral icterus.  Chest and Lung Exam Chest and lung exam reveals -quiet, even and easy respiratory effort with no use of accessory muscles. Inspection Chest Wall - Normal. Back - normal.  Cardiovascular Cardiovascular examination reveals -normal heart sounds, regular rate and rhythm with no  murmurs.  Abdomen Inspection Skin - Scar - no surgical scars. Hernias - Inguinal hernia - Right - Reducible(large). Palpation/Percussion Normal exam - Soft, Non Tender, No Rebound tenderness, No Rigidity (guarding) and No hepatosplenomegaly. Auscultation Normal exam - Bowel sounds normal.  Neurologic Neurologic evaluation reveals -alert and oriented x 3 with no impairment of recent or remote memory. Mental Status-Normal.  Musculoskeletal Normal Exam - Left-Upper Extremity Strength Normal and Lower Extremity Strength Normal. Normal Exam - Right-Upper Extremity Strength Normal, Lower Extremity Weakness.    Assessment & Plan Ralene Ok MD; 05/21/2015 10:07 AM) RIGHT INGUINAL HERNIA (K40.90) Impression: 74 year old male with a large right inguinal hernia.  1. The patient will have to be off of Plavix for 7 days we will send Dr. Martinique a clearance 2. 1. The patient will like to proceed to the operating room for open right inguinal hernia repair. 3. I discussed with the patient the signs and symptoms of incarceration and strangulation and the need to proceed to the ER should they occur. 4. I discussed with the patient the risks and benefits of the procedure to include but not limited to: Infection, bleeding, damage to surrounding structures, possible need for further surgery, possible nerve pain, and possible recurrence. The patient was understanding and wishes to proceed.

## 2015-06-05 NOTE — Anesthesia Preprocedure Evaluation (Signed)
Anesthesia Evaluation  Patient identified by MRN, date of birth, ID band Patient awake    Reviewed: Allergy & Precautions, NPO status , Patient's Chart, lab work & pertinent test results  Airway Mallampati: II  TM Distance: >3 FB Neck ROM: Full    Dental  (+) Teeth Intact, Dental Advisory Given   Pulmonary former smoker,    breath sounds clear to auscultation       Cardiovascular hypertension,  Rhythm:Regular Rate:Normal     Neuro/Psych    GI/Hepatic   Endo/Other    Renal/GU      Musculoskeletal   Abdominal   Peds  Hematology   Anesthesia Other Findings   Reproductive/Obstetrics                             Anesthesia Physical Anesthesia Plan  ASA: III  Anesthesia Plan: General   Post-op Pain Management:    Induction: Intravenous  Airway Management Planned: Oral ETT  Additional Equipment:   Intra-op Plan:   Post-operative Plan: Extubation in OR  Informed Consent: I have reviewed the patients History and Physical, chart, labs and discussed the procedure including the risks, benefits and alternatives for the proposed anesthesia with the patient or authorized representative who has indicated his/her understanding and acceptance.   Dental advisory given  Plan Discussed with: CRNA and Anesthesiologist  Anesthesia Plan Comments:         Anesthesia Quick Evaluation  

## 2015-06-05 NOTE — Anesthesia Postprocedure Evaluation (Signed)
Anesthesia Post Note  Patient: Zachary Liu  Procedure(s) Performed: Procedure(s) (LRB): OPEN RIGHT INGUINAL HERNIA REPAIR WITH MESH (Right) INSERTION OF MESH (Right)  Patient location during evaluation: PACU Anesthesia Type: General Level of consciousness: awake, awake and alert and oriented Pain management: pain level controlled Vital Signs Assessment: post-procedure vital signs reviewed and stable Respiratory status: spontaneous breathing, nonlabored ventilation and respiratory function stable Anesthetic complications: no    Last Vitals:  Filed Vitals:   06/05/15 1045 06/05/15 1052  BP: 127/80 130/79  Pulse: 63 62  Temp: 36.7 C 36.7 C  Resp: 16 14    Last Pain:  Filed Vitals:   06/05/15 1053  PainSc: 3                  Marris Frontera COKER

## 2015-06-05 NOTE — Discharge Instructions (Signed)
[facility] _______[facility] ° °INGUINAL HERNIA REPAIR: POST OP INSTRUCTIONS ° °Always review your discharge instruction sheet given to you by the facility where your surgery was performed. °IF YOU HAVE DISABILITY OR FAMILY LEAVE FORMS, YOU MUST BRING THEM TO THE OFFICE FOR PROCESSING.   °DO NOT GIVE THEM TO YOUR DOCTOR. ° °1. A  prescription for pain medication may be given to you upon discharge.  Take your pain medication as prescribed, if needed.  If narcotic pain medicine is not needed, then you may take acetaminophen (Tylenol) or ibuprofen (Advil) as needed. °2. Take your usually prescribed medications unless otherwise directed. °3. If you need a refill on your pain medication, please contact your pharmacy.  They will contact our office to request authorization. Prescriptions will not be filled after 5 pm or on week-ends. °4. You should follow a light diet the first 24 hours after arrival home, such as soup and crackers, etc.  Be sure to include lots of fluids daily.  Resume your normal diet the day after surgery. °5. Most patients will experience some swelling and bruising around the umbilicus or in the groin and scrotum.  Ice packs and reclining will help.  Swelling and bruising can take several days to resolve.  °6. It is common to experience some constipation if taking pain medication after surgery.  Increasing fluid intake and taking a stool softener (such as Colace) will usually help or prevent this problem from occurring.  A mild laxative (Milk of Magnesia or Miralax) should be taken according to package directions if there are no bowel movements after 48 hours. °7. Unless discharge instructions indicate otherwise, you may remove your bandages 24-48 hours after surgery, and you may shower at that time.  You may have steri-strips (small skin tapes) in place directly over the incision.  These strips should be left on the skin for 7-10 days.  If your surgeon used skin glue on the incision, you  may shower in 24 hours.  The glue will flake off over the next 2-3 weeks.  Any sutures or staples will be removed at the office during your follow-up visit. °8. ACTIVITIES:  You may resume regular (light) daily activities beginning the next day--such as daily self-care, walking, climbing stairs--gradually increasing activities as tolerated.  You may have sexual intercourse when it is comfortable.  Refrain from any heavy lifting or straining until approved by your doctor. °a. You may drive when you are no longer taking prescription pain medication, you can comfortably wear a seatbelt, and you can safely maneuver your car and apply brakes. °b. RETURN TO WORK:  __________________________________________________________ °9. You should see your doctor in the office for a follow-up appointment approximately 2-3 weeks after your surgery.  Make sure that you call for this appointment within a day or two after you arrive home to insure a convenient appointment time. °10. OTHER INSTRUCTIONS:  __________________________________________________________________________________________________________________________________________________________________________________________  °WHEN TO CALL YOUR DOCTOR: °1. Fever over 101.0 °2. Inability to urinate °3. Nausea and/or vomiting °4. Extreme swelling or bruising °5. Continued bleeding from incision. °6. Increased pain, redness, or drainage from the incision ° °The clinic staff is available to answer your questions during regular business hours.  Please don’t hesitate to call and ask to speak to one of the nurses for clinical concerns.  If you have a medical emergency, go to the nearest emergency room or call 911.  A surgeon from [facility] is always on call at the hospital ° ° °[address], [address], [place], [place]  [address] ? ° [address] [address], [place], [place]   [address] °[phone] ? [phone] ? FAX [phone] °Web site:  [email or web address] ° °

## 2015-06-06 ENCOUNTER — Encounter (HOSPITAL_COMMUNITY): Payer: Self-pay | Admitting: General Surgery

## 2015-07-08 ENCOUNTER — Ambulatory Visit (INDEPENDENT_AMBULATORY_CARE_PROVIDER_SITE_OTHER): Payer: 59 | Admitting: Family Medicine

## 2015-07-08 ENCOUNTER — Encounter: Payer: Self-pay | Admitting: Family Medicine

## 2015-07-08 ENCOUNTER — Encounter (INDEPENDENT_AMBULATORY_CARE_PROVIDER_SITE_OTHER): Payer: Self-pay

## 2015-07-08 VITALS — BP 110/70 | HR 64 | Temp 97.9°F | Ht 71.25 in | Wt 215.5 lb

## 2015-07-08 DIAGNOSIS — I10 Essential (primary) hypertension: Secondary | ICD-10-CM | POA: Diagnosis not present

## 2015-07-08 DIAGNOSIS — E785 Hyperlipidemia, unspecified: Secondary | ICD-10-CM

## 2015-07-08 DIAGNOSIS — I255 Ischemic cardiomyopathy: Secondary | ICD-10-CM | POA: Diagnosis not present

## 2015-07-08 DIAGNOSIS — I2510 Atherosclerotic heart disease of native coronary artery without angina pectoris: Secondary | ICD-10-CM | POA: Diagnosis not present

## 2015-07-08 NOTE — Assessment & Plan Note (Signed)
Continue ACEI, metoprolol

## 2015-07-08 NOTE — Assessment & Plan Note (Signed)
Chronic, stable on lipitor.  

## 2015-07-08 NOTE — Assessment & Plan Note (Signed)
Stable. ?true hypertension vs antihypertensives for cardiac treatment after MI.

## 2015-07-08 NOTE — Progress Notes (Signed)
BP 110/70 mmHg  Pulse 64  Temp(Src) 97.9 F (36.6 C) (Oral)  Ht 5' 11.25" (1.81 m)  Wt 215 lb 8 oz (97.75 kg)  BMI 29.84 kg/m2   CC: new pt to establish care  Subjective:    Patient ID: Zachary Liu, male    DOB: 1941/04/04, 75 y.o.   MRN: [id number]  HPI: EULOGIO RAIKES is a 75 y.o. male presenting on 07/08/2015 for [place] from [place]. I will see wife on Thursday Civil engineer, contracting of [facility]). No prior PCP. Retired Therapist, sports. Intermittently travels to [place].  HTN - unsure if h/o this. Compliant with current antihypertensive regimen of metoprolol 25mg  bid and ramipril 2.5mg  daily.  Does not check blood pressures at home. No low blood pressure readings or symptoms of dizziness/syncope. Denies HA, vision changes, CP/tightness, SOB, leg swelling.   HLD - compliant with lipitor 40mg  daily without myalgias.  CAD s/p MI 2010 with ischemic CM on aspirin and plavix. Had stent of proximal and mid LAD as well as proximal RCA. Sees Dr Martinique. Latest EF 43% with global hypokinesis and apical akinesis.   H/o GI bleed from M-W tear.   Recent R inguinal hernia repair by Dr Rosendo Gros.   Preventative:  No recent CPE  Medicare part A   Moved from [place]. Wife is Mudlogger of [employer].  Occ: Retired Therapist, sports Edu: RN Activity: walking 1.5 mi 3x/wk at [employer] Diet: water, fruits/vegetables daily  Relevant past medical, surgical, family and social history reviewed and updated as indicated. Interim medical history since our last visit reviewed. Allergies and medications reviewed and updated. Current Outpatient Prescriptions on File Prior to Visit  Medication Sig  . aspirin 81 MG tablet Take 81 mg by mouth daily.  Marland Kitchen atorvastatin (LIPITOR) 40 MG tablet Take 1 tablet (40 mg total) by mouth daily.  . metoprolol tartrate (LOPRESSOR) 25 MG tablet Take 1 tablet (25 mg total) by mouth 2 (two) times daily.  Marland Kitchen NITROSTAT 0.4 MG SL tablet Place 1 tablet (0.4 mg total) under the tongue every 5 (five) minutes  as needed for chest pain.  . ramipril (ALTACE) 2.5 MG capsule Take 1 capsule (2.5 mg total) by mouth daily. (Patient taking differently: Take 2.5 mg by mouth at bedtime. )   No current facility-administered medications on file prior to visit.    Review of Systems Per HPI unless specifically indicated in ROS section     Objective:    BP 110/70 mmHg  Pulse 64  Temp(Src) 97.9 F (36.6 C) (Oral)  Ht 5' 11.25" (1.81 m)  Wt 215 lb 8 oz (97.75 kg)  BMI 29.84 kg/m2  Wt Readings from Last 3 Encounters:  07/08/15 215 lb 8 oz (97.75 kg)  06/05/15 218 lb (98.884 kg)  05/15/15 218 lb 3 oz (98.969 kg)    Physical Exam  Constitutional: He is oriented to person, place, and time. He appears well-developed and well-nourished. No distress.  HENT:  Head: Normocephalic and atraumatic.  Right Ear: Hearing, tympanic membrane, external ear and ear canal normal.  Left Ear: Hearing, tympanic membrane, external ear and ear canal normal.  Nose: Nose normal.  Mouth/Throat: Uvula is midline, oropharynx is clear and moist and mucous membranes are normal. No oropharyngeal exudate, posterior oropharyngeal edema or posterior oropharyngeal erythema.  Eyes: Conjunctivae and EOM are normal. Pupils are equal, round, and reactive to light. No scleral icterus.  Neck: Normal range of motion. Neck supple.  Cardiovascular: Normal rate, regular rhythm, normal heart  sounds and intact distal pulses.   No murmur heard. Pulses:      Radial pulses are 2+ on the right side, and 2+ on the left side.  Pulmonary/Chest: Effort normal and breath sounds normal. No respiratory distress. He has no wheezes. He has no rales.  Musculoskeletal: Normal range of motion. He exhibits no edema.  Lymphadenopathy:    He has no cervical adenopathy.  Neurological: He is alert and oriented to person, place, and time.  CN grossly intact, station and gait intact  Skin: Skin is warm and dry. No rash noted.  Psychiatric: He has a normal mood and  affect.  Nursing note and vitals reviewed.  Results for orders placed or performed during the hospital encounter of [id number]  Basic metabolic panel  Result Value Ref Range   Sodium 140 135 - 145 mmol/L   Potassium 4.1 3.5 - 5.1 mmol/L   Chloride 109 101 - 111 mmol/L   CO2 22 22 - 32 mmol/L   Glucose, Bld 96 65 - 99 mg/dL   BUN 15 6 - 20 mg/dL   Creatinine, Ser 1.09 0.61 - 1.24 mg/dL   Calcium 8.8 (L) 8.9 - 10.3 mg/dL   GFR calc non Af Amer >60 >60 mL/min   GFR calc Af Amer >60 >60 mL/min   Anion gap 9 5 - 15  CBC  Result Value Ref Range   WBC 6.7 4.0 - 10.5 K/uL   RBC 4.72 4.22 - 5.81 MIL/uL   Hemoglobin 14.1 13.0 - 17.0 g/dL   HCT 43.4 39.0 - 52.0 %   MCV 91.9 78.0 - 100.0 fL   MCH 29.9 26.0 - 34.0 pg   MCHC 32.5 30.0 - 36.0 g/dL   RDW 14.0 11.5 - 15.5 %   Platelets 188 150 - 400 K/uL      Assessment & Plan:   Problem List Items Addressed This Visit    Ischemic cardiomyopathy    Continue ACEI, metoprolol      Hyperlipidemia    Chronic, stable on lipitor.      HTN (hypertension) - Primary    Stable. ?true hypertension vs antihypertensives for cardiac treatment after MI.      Coronary artery disease    Closely followed by cardiology. Overall stable. Continue current regimen.          Follow up plan: Return in about 6 months (around 01/05/2016), or as needed, for annual exam, prior fasting for blood work.

## 2015-07-08 NOTE — Patient Instructions (Addendum)
Double check on pneumonia shot. Return in 4-6 months for physical, sooner if needed. Good to meet you today, call us with questions.

## 2015-07-08 NOTE — Assessment & Plan Note (Signed)
Closely followed by cardiology. Overall stable. Continue current regimen.

## 2015-07-08 NOTE — Progress Notes (Signed)
Pre visit review using our clinic review tool, if applicable. No additional management support is needed unless otherwise documented below in the visit note. 

## 2015-07-18 MED FILL — CLOPIDOGREL 75 MG TABLET: 75 | 90 days supply | Qty: 90 | Fill #0

## 2015-09-18 MED FILL — ATORVASTATIN 40 MG TABLET: 40 | 90 days supply | Qty: 90 | Fill #1

## 2015-10-02 MED FILL — RAMIPRIL 2.5 MG CAPSULE: 2.5 | 90 days supply | Qty: 90 | Fill #1

## 2015-10-02 MED FILL — METOPROLOL TARTRATE 25 MG T: 25 | 90 days supply | Qty: 180 | Fill #1

## 2015-10-13 MED FILL — CLOPIDOGREL 75 MG TABLET: 75 | 90 days supply | Qty: 90 | Fill #1

## 2015-12-11 MED FILL — ATORVASTATIN 40 MG TABLET: 40 | 90 days supply | Qty: 90 | Fill #2

## 2015-12-15 ENCOUNTER — Telehealth: Payer: Self-pay | Admitting: Cardiology

## 2015-12-15 MED ORDER — NITROGLYCERIN 0.4 MG SL SUBL: 0.4000 mg | SUBLINGUAL_TABLET | SUBLINGUAL | Status: DC | PRN

## 2015-12-15 MED FILL — NITROGLYCERIN 0.4 MG TAB SL: 0.4 | 13 days supply | Qty: 25 | Fill #0

## 2015-12-15 NOTE — Telephone Encounter (Signed)
Rx(s) sent to pharmacy electronically.  

## 2015-12-29 MED FILL — METOPROLOL TARTRATE 25 MG T: 25 | 90 days supply | Qty: 180 | Fill #2

## 2015-12-29 MED FILL — RAMIPRIL 2.5 MG CAPSULE: 2.5 | 90 days supply | Qty: 90 | Fill #2

## 2016-01-14 MED FILL — CLOPIDOGREL 75 MG TABLET: 75 | 90 days supply | Qty: 90 | Fill #2

## 2016-03-15 MED FILL — ATORVASTATIN 40 MG TABLET: 40 | 90 days supply | Qty: 90 | Fill #3

## 2016-03-29 MED FILL — RAMIPRIL 2.5 MG CAPSULE: 2.5 | 90 days supply | Qty: 90 | Fill #3

## 2016-03-29 MED FILL — METOPROLOL TARTRATE 25 MG T: 25 | 90 days supply | Qty: 180 | Fill #3

## 2016-04-09 MED FILL — CLOPIDOGREL 75 MG TABLET: 75 | 90 days supply | Qty: 90 | Fill #3

## 2016-06-14 ENCOUNTER — Other Ambulatory Visit: Payer: Self-pay | Admitting: *Deleted

## 2016-06-14 MED ORDER — ATORVASTATIN CALCIUM 40 MG PO TABS: 40.0000 mg | ORAL_TABLET | Freq: Every day | ORAL | 0 refills | Status: DC

## 2016-06-14 MED FILL — ATORVASTATIN 40 MG TABLET: 40 | 90 days supply | Qty: 90 | Fill #0

## 2016-06-21 ENCOUNTER — Other Ambulatory Visit: Payer: Self-pay | Admitting: Cardiology

## 2016-06-21 MED FILL — RAMIPRIL 2.5 MG CAPSULE: 2.5 | 90 days supply | Qty: 90 | Fill #0

## 2016-06-21 MED FILL — METOPROLOL TARTRATE 25 MG T: 25 | 90 days supply | Qty: 180 | Fill #0

## 2016-07-09 NOTE — Progress Notes (Signed)
[name] Date of Birth: September 13, 1940   History of Present Illness: [name] is seen today for followup CAD. He is status post anterior myocardial infarction in September 2010. He had fairly extensive stenting of the proximal and mid LAD. He also had a long stent placed in the proximal right coronary. He had a follow up Myoview study in April 2015 showed fixed inferior and apical defects. No ischemia. EF 43%. On follow up today he states he has been feeling very well. He remains active. He has had no significant chest pain, shortness of breath, or palpitations. He had inguinal hernia repair earlier this year without problems. He denies any dizziness or fatigue.  Allergies as of 07/12/2016   No Known Allergies     Medication List       Accurate as of 07/12/16 10:25 AM. Always use your most recent med list.          aspirin 81 MG tablet Take 81 mg by mouth daily.   atorvastatin 40 MG tablet Commonly known as:  LIPITOR Take 1 tablet (40 mg total) by mouth daily.   clopidogrel 75 MG tablet Commonly known as:  PLAVIX Take 1 tablet (75 mg total) by mouth daily.   metoprolol tartrate 25 MG tablet Commonly known as:  LOPRESSOR TAKE 1 TABLET BY MOUTH 2 TIMES DAILY.   multivitamin tablet Take 1 tablet by mouth daily.   nitroGLYCERIN 0.4 MG SL tablet Commonly known as:  NITROSTAT Place 1 tablet (0.4 mg total) under the tongue every 5 (five) minutes as needed for chest pain.   ramipril 2.5 MG capsule Commonly known as:  ALTACE TAKE 1 CAPSULE BY MOUTH DAILY.       No Known Allergies  Past Medical History:  Diagnosis Date  . Coronary artery disease   . GI bleed    due to a Mallory Weiss tear during heart attack  . History of anemia    after GIB from [facility] tear  . History of chicken pox   . HTN (hypertension)    pt denies  . Hyperlipidemia   . Ischemic cardiomyopathy   . Myocardial infarction of anterior wall greater than eight weeks ago   . 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 never treated.     Past Surgical History:  Procedure Laterality Date  . CARDIAC CATHETERIZATION  04/18/2009   with stenting/successful intracoronary stenting of the proximal rt coronary artery with a drug-cluting stent    . CARDIAC CATHETERIZATION  03/09/2009   EF30-35%/severe 3-vessel obstructive atherosclerotic coronary artery disease,this is culprit/total occlusion of the proximal LAD/high-grade stenosis in the proximal rt coronary artery/subsequently noted there was some mod to severe stenosis in the mid LAD/successful intracoronary stenting of the proximal&mid LAD using drug-eluting stents/severe lt ventricular dysfunction  . ESOPHAGOGASTRODUODENOSCOPY  03/10/2009   Mallory-Weiss tears, one of them was actively bleeding/these were injected with epinephrine with control of hemorrhage  . HERNIA REPAIR Left 2013   inguinal hernia ([place])  . INGUINAL HERNIA REPAIR Right 06/05/2015   Procedure: OPEN RIGHT INGUINAL HERNIA REPAIR WITH MESH;  Surgeon: Ralene Ok, MD;  Location: [facility];  Service: General;  Laterality: Right;  . INSERTION OF MESH Right 06/05/2015   Procedure: INSERTION OF MESH;  Surgeon: Ralene Ok, MD;  Location: [facility];  Service: General;  Laterality: Right;  . TONSILLECTOMY  1946    History  Smoking Status  . Former Smoker  . Quit date: 07/06/1983  Smokeless Tobacco  .  Never Used    History  Alcohol Use No    Family History  Problem Relation Age of Onset  . Peripheral vascular disease Mother     bilateral leg amputations  . Hyperlipidemia Mother   . Stroke Mother   . Valvular heart disease Brother   . CAD Neg Hx   . Diabetes Neg Hx   . Cancer Neg Hx     Review of Systems: As noted in history of present illness. All other systems were reviewed and are negative.  Physical Exam: BP 128/74 (BP Location: Left Arm, Patient Position: Sitting, Cuff Size: Large)   Pulse (!) 49   Ht 6' (1.829 m)   Wt 220 lb 8 oz (100 kg)   BMI  29.91 kg/m  He is a pleasant white male in no acute distress. He is normocephalic, atraumatic. Pupils equal round and reactive. Sclera are clear. Neck is supple without JVD, adenopathy, thyromegaly, or bruits. Lungs are clear. Cardiac exam reveals a regular rate and rhythm without gallop, murmur, or click. Abdomen is soft and nontender without masses or bruits. Extremities are without edema. Pulses are 2+ and symmetric. He is alert and oriented x3. Cranial nerves II through XII are intact.  LABORATORY DATA:  Lab Results  Component Value Date   WBC 6.7 06/05/2015   HGB 14.1 06/05/2015   HCT 43.4 06/05/2015   PLT 188 06/05/2015   GLUCOSE 96 06/05/2015   CHOL 121 (L) 05/19/2015   TRIG 72 05/19/2015   HDL 45 05/19/2015   LDLCALC 62 05/19/2015   ALT 15 05/19/2015   AST 20 05/19/2015   NA 140 06/05/2015   K 4.1 06/05/2015   CL 109 06/05/2015   CREATININE 1.09 06/05/2015   BUN 15 06/05/2015   CO2 22 06/05/2015   TSH 2.902 Test methodology is 3rd generation TSH 03/10/2009   INR 1.04 01/02/2010   HGBA1C  03/10/2009    5.6 (NOTE) The ADA recommends the following therapeutic goal for glycemic control related to Hgb A1c measurement: Goal of therapy: <6.5 Hgb A1c  Reference: American Diabetes Association: Clinical Practice Recommendations 2010, Diabetes Care, 2010, 33: (Suppl  1).    ECG demonstrates sinus bradycardia with old septal infarct. Rate 49 bpm. No acute change.I have personally reviewed and interpreted this study.  Assessment / Plan: 1. Coronary disease with remote anterior myocardial infarction. Status post DES to the proximal and mid LAD. Later DES placement to the proximal RCA. Patient remains asymptomatic. Myoview study in April 2015 showed old scar but no ischemia.  Continue current medical therapy. On long term DAPT. If he develops any symptoms may need to reduce metoprolol due to bradycardia.  2. Ischemic cardiomyopathy. EF 43%. Asymptomatic. Continue ACEi and metoprolol.    3. Hypertension-controlled.  4. Hypercholesterolemia-Continue statin therapy. Will schedule for fasting chemistries and a lipid panel.   I will follow up in one year.

## 2016-07-12 ENCOUNTER — Ambulatory Visit (INDEPENDENT_AMBULATORY_CARE_PROVIDER_SITE_OTHER): Payer: Medicare Other | Admitting: Cardiology

## 2016-07-12 ENCOUNTER — Other Ambulatory Visit: Payer: Self-pay

## 2016-07-12 ENCOUNTER — Telehealth: Payer: Self-pay | Admitting: Cardiology

## 2016-07-12 ENCOUNTER — Encounter: Payer: Self-pay | Admitting: Cardiology

## 2016-07-12 VITALS — BP 128/74 | HR 49 | Ht 72.0 in | Wt 220.5 lb

## 2016-07-12 DIAGNOSIS — E7800 Pure hypercholesterolemia, unspecified: Secondary | ICD-10-CM | POA: Diagnosis not present

## 2016-07-12 DIAGNOSIS — I10 Essential (primary) hypertension: Secondary | ICD-10-CM | POA: Diagnosis not present

## 2016-07-12 DIAGNOSIS — Z23 Encounter for immunization: Secondary | ICD-10-CM

## 2016-07-12 DIAGNOSIS — I2510 Atherosclerotic heart disease of native coronary artery without angina pectoris: Secondary | ICD-10-CM | POA: Diagnosis not present

## 2016-07-12 MED ORDER — ATORVASTATIN CALCIUM 40 MG PO TABS: 40.0000 mg | ORAL_TABLET | Freq: Every day | ORAL | 3 refills | Status: DC

## 2016-07-12 MED ORDER — RAMIPRIL 2.5 MG PO CAPS: 2.5000 mg | ORAL_CAPSULE | Freq: Every day | ORAL | 3 refills | Status: DC

## 2016-07-12 MED ORDER — METOPROLOL TARTRATE 25 MG PO TABS: 25.0000 mg | ORAL_TABLET | Freq: Two times a day (BID) | ORAL | 3 refills | Status: DC

## 2016-07-12 MED ORDER — CLOPIDOGREL BISULFATE 75 MG PO TABS: 75.0000 mg | ORAL_TABLET | Freq: Every day | ORAL | 3 refills | Status: DC

## 2016-07-12 NOTE — Patient Instructions (Signed)
We will check blood work today  I will see you in one year.

## 2016-07-12 NOTE — Telephone Encounter (Signed)
Dispense recommendations clarified w pharmacy after review of notes and chart - advised OK to fill generic.  No further needs from pharmacy at this time.

## 2016-07-12 NOTE — Addendum Note (Signed)
Addended by: Kathyrn Lass on: 07/12/2016 10:31 AM   Modules accepted: Orders

## 2016-07-12 NOTE — Telephone Encounter (Signed)
°  New Prob  Has a question regarding Metoprolol. Calling to verify if medication should be filled as generic or brand name. Please call.

## 2016-07-15 DIAGNOSIS — E7800 Pure hypercholesterolemia, unspecified: Secondary | ICD-10-CM | POA: Diagnosis not present

## 2016-07-15 DIAGNOSIS — I2510 Atherosclerotic heart disease of native coronary artery without angina pectoris: Secondary | ICD-10-CM | POA: Diagnosis not present

## 2016-07-15 DIAGNOSIS — I10 Essential (primary) hypertension: Secondary | ICD-10-CM | POA: Diagnosis not present

## 2016-07-15 LAB — BASIC METABOLIC PANEL
BUN: 20 mg/dL (ref 7–25)
CHLORIDE: 104 mmol/L (ref 98–110)
CO2: 31 mmol/L (ref 20–31)
Calcium: 9 mg/dL (ref 8.6–10.3)
Creat: 1.15 mg/dL (ref 0.70–1.18)
Glucose, Bld: 88 mg/dL (ref 65–99)
POTASSIUM: 4.3 mmol/L (ref 3.5–5.3)
Sodium: 138 mmol/L (ref 135–146)

## 2016-07-15 LAB — HEPATIC FUNCTION PANEL
ALT: 13 U/L (ref 9–46)
AST: 19 U/L (ref 10–35)
Albumin: 3.9 g/dL (ref 3.6–5.1)
Alkaline Phosphatase: 54 U/L (ref 40–115)
BILIRUBIN INDIRECT: 0.6 mg/dL (ref 0.2–1.2)
Bilirubin, Direct: 0.1 mg/dL (ref <=0.2)
TOTAL PROTEIN: 6.5 g/dL (ref 6.1–8.1)
Total Bilirubin: 0.7 mg/dL (ref 0.2–1.2)

## 2016-07-15 LAB — LIPID PANEL
CHOL/HDL RATIO: 2.7 ratio (ref <5.0)
Cholesterol: 138 mg/dL (ref <200)
HDL: 51 mg/dL (ref >40)
LDL CALC: 69 mg/dL (ref <100)
TRIGLYCERIDES: 88 mg/dL (ref <150)
VLDL: 18 mg/dL (ref <30)

## 2017-06-27 ENCOUNTER — Other Ambulatory Visit: Payer: Self-pay | Admitting: Cardiology

## 2017-06-27 DIAGNOSIS — I2510 Atherosclerotic heart disease of native coronary artery without angina pectoris: Secondary | ICD-10-CM

## 2017-06-27 NOTE — Telephone Encounter (Signed)
REFILL 

## 2017-07-01 ENCOUNTER — Other Ambulatory Visit: Payer: Self-pay

## 2017-07-01 MED ORDER — ATORVASTATIN CALCIUM 40 MG PO TABS: 40.0000 mg | ORAL_TABLET | Freq: Every day | ORAL | 0 refills | Status: DC

## 2017-07-19 ENCOUNTER — Encounter: Payer: Self-pay | Admitting: Cardiology

## 2017-07-27 NOTE — Progress Notes (Signed)
Zachary Liu Date of Birth: 11/29/1940   History of Present Illness: Mr. Bulthuis is seen today for followup CAD. He is status post anterior myocardial infarction in September 2010. He had fairly extensive stenting of the proximal and mid LAD. He also had a long stent placed in the proximal right coronary. He had a follow up Myoview study in April 2015 showed fixed inferior and apical defects. No ischemia. EF 43%. On follow up today he states he has been feeling very well. Now living at [place] [place] in a duplex home with his wife. He remains very  active[place] and [place]. He has had no significant chest pain, shortness of breath, or palpitations.  He denies any dizziness or fatigue.  Allergies as of 08/04/2017   No Known Allergies     Medication List        Accurate as of 08/04/17  9:29 AM. Always use your most recent med list.          aspirin 81 MG tablet Take 81 mg by mouth daily.   atorvastatin 40 MG tablet Commonly known as:  LIPITOR Take 1 tablet (40 mg total) by mouth daily.   clopidogrel 75 MG tablet Commonly known as:  PLAVIX TAKE 1 TABLET DAILY   metoprolol tartrate 25 MG tablet Commonly known as:  LOPRESSOR TAKE 1 TABLET TWICE A DAY.   multivitamin tablet Take 1 tablet by mouth daily.   nitroGLYCERIN 0.4 MG SL tablet Commonly known as:  NITROSTAT Place 1 tablet (0.4 mg total) under the tongue every 5 (five) minutes as needed for chest pain.   ramipril 2.5 MG capsule Commonly known as:  ALTACE TAKE 1 CAPSULE DAILY       No Known Allergies  Past Medical History:  Diagnosis Date  . Coronary artery disease   . GI bleed    due to a Mallory Weiss tear during heart attack  . History of anemia    after GIB from [facility] tear  . History of chicken pox   . HTN (hypertension)    pt denies  . Hyperlipidemia   . Ischemic cardiomyopathy   . Myocardial infarction of anterior wall greater than eight weeks ago   .  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 never treated.     Past Surgical History:  Procedure Laterality Date  . CARDIAC CATHETERIZATION  04/18/2009   with stenting/successful intracoronary stenting of the proximal rt coronary artery with a drug-cluting stent    . CARDIAC CATHETERIZATION  03/09/2009   EF30-35%/severe 3-vessel obstructive atherosclerotic coronary artery disease,this is culprit/total occlusion of the proximal LAD/high-grade stenosis in the proximal rt coronary artery/subsequently noted there was some mod to severe stenosis in the mid LAD/successful intracoronary stenting of the proximal&mid LAD using drug-eluting stents/severe lt ventricular dysfunction  . ESOPHAGOGASTRODUODENOSCOPY  03/10/2009   Mallory-Weiss tears, one of them was actively bleeding/these were injected with epinephrine with control of hemorrhage  . HERNIA REPAIR Left 2013   inguinal hernia ([place])  . INGUINAL HERNIA REPAIR Right 06/05/2015   Procedure: OPEN RIGHT INGUINAL HERNIA REPAIR WITH MESH;  Surgeon: Ralene Ok, MD;  Location: [facility];  Service: General;  Laterality: Right;  . INSERTION OF MESH Right 06/05/2015   Procedure: INSERTION OF MESH;  Surgeon: Ralene Ok, MD;  Location: [facility];  Service: General;  Laterality: Right;  . TONSILLECTOMY  1946    Social History   Tobacco Use  Smoking Status Former Smoker  . Last attempt to quit: 07/06/1983  . Years since quitting: 34.1  Smokeless Tobacco Never Used    Social History   Substance and Sexual Activity  Alcohol Use No  . Alcohol/week: 0.0 oz    Family History  Problem Relation Age of Onset  . Peripheral vascular disease Mother        bilateral leg amputations  . Hyperlipidemia Mother   . Stroke Mother   . Valvular heart disease Brother   . CAD Neg Hx   . Diabetes Neg Hx   . Cancer Neg Hx     Review of Systems: As noted in history of present illness. All other systems were reviewed and are negative.  Physical Exam: BP  114/70   Pulse (!) 51   Ht 6' (1.829 m)   Wt 220 lb 6.4 oz (100 kg)   BMI 29.89 kg/m  GENERAL:  Well appearing WM in NAD HEENT:  PERRL, EOMI, sclera are clear. Oropharynx is clear. NECK:  No jugular venous distention, carotid upstroke brisk and symmetric, no bruits, no thyromegaly or adenopathy LUNGS:  Clear to auscultation bilaterally CHEST:  Unremarkable HEART:  RRR,  PMI not displaced or sustained,S1 and S2 within normal limits, no S3, no S4: no clicks, no rubs, no murmurs ABD:  Soft, nontender. BS +, no masses or bruits. No hepatomegaly, no splenomegaly EXT:  2 + pulses throughout, no edema, no cyanosis no clubbing SKIN:  Warm and dry.  No rashes NEURO:  Alert and oriented x 3. Cranial nerves II through XII intact. PSYCH:  Cognitively intact    LABORATORY DATA:  Lab Results  Component Value Date   WBC 6.7 06/05/2015   HGB 14.1 06/05/2015   HCT 43.4 06/05/2015   PLT 188 06/05/2015   GLUCOSE 88 07/15/2016   CHOL 138 07/15/2016   TRIG 88 07/15/2016   HDL 51 07/15/2016   LDLCALC 69 07/15/2016   ALT 13 07/15/2016   AST 19 07/15/2016   NA 138 07/15/2016   K 4.3 07/15/2016   CL 104 07/15/2016   CREATININE 1.15 07/15/2016   BUN 20 07/15/2016   CO2 31 07/15/2016   TSH 2.902 Test methodology is 3rd generation TSH 03/10/2009   INR 1.04 01/02/2010   HGBA1C  03/10/2009    5.6 (NOTE) The ADA recommends the following therapeutic goal for glycemic control related to Hgb A1c measurement: Goal of therapy: <6.5 Hgb A1c  Reference: American Diabetes Association: Clinical Practice Recommendations 2010, Diabetes Care, 2010, 33: (Suppl  1).    ECG demonstrates sinus bradycardia with old septal infarct. Rate 51 bpm. No acute change.I have personally reviewed and interpreted this study.  Assessment / Plan: 1. Coronary disease with remote anterior myocardial infarction. Status post DES to the proximal and mid LAD. Later DES placement to the proximal RCA. Patient remains asymptomatic.  Myoview study in April 2015 showed old scar but no ischemia.  He is completely asymptomatic.Continue current medical therapy. On long term DAPT.  2. Ischemic cardiomyopathy. EF 43%. Asymptomatic. Continue ACEi and metoprolol.   3. Hypertension-controlled.  4. Hypercholesterolemia-Continue statin therapy. Will obtain fasting lab work today.   I will follow up in one year.

## 2017-08-04 ENCOUNTER — Encounter: Payer: Self-pay | Admitting: Cardiology

## 2017-08-04 ENCOUNTER — Ambulatory Visit (INDEPENDENT_AMBULATORY_CARE_PROVIDER_SITE_OTHER): Payer: Medicare Other | Admitting: Cardiology

## 2017-08-04 VITALS — BP 114/70 | HR 51 | Ht 72.0 in | Wt 220.4 lb

## 2017-08-04 DIAGNOSIS — I2510 Atherosclerotic heart disease of native coronary artery without angina pectoris: Secondary | ICD-10-CM

## 2017-08-04 DIAGNOSIS — E7800 Pure hypercholesterolemia, unspecified: Secondary | ICD-10-CM

## 2017-08-04 DIAGNOSIS — Z23 Encounter for immunization: Secondary | ICD-10-CM | POA: Diagnosis not present

## 2017-08-04 DIAGNOSIS — I10 Essential (primary) hypertension: Secondary | ICD-10-CM | POA: Diagnosis not present

## 2017-08-04 LAB — BASIC METABOLIC PANEL
BUN / CREAT RATIO: 21 (ref 10–24)
BUN: 26 mg/dL (ref 8–27)
CO2: 24 mmol/L (ref 20–29)
CREATININE: 1.23 mg/dL (ref 0.76–1.27)
Calcium: 9.4 mg/dL (ref 8.6–10.2)
Chloride: 103 mmol/L (ref 96–106)
GFR calc Af Amer: 65 mL/min/{1.73_m2} (ref >59)
GFR, EST NON AFRICAN AMERICAN: 56 mL/min/{1.73_m2} — AB (ref >59)
GLUCOSE: 92 mg/dL (ref 65–99)
Potassium: 4.6 mmol/L (ref 3.5–5.2)
SODIUM: 142 mmol/L (ref 134–144)

## 2017-08-04 LAB — LIPID PANEL W/O CHOL/HDL RATIO
CHOLESTEROL TOTAL: 132 mg/dL (ref 100–199)
HDL: 51 mg/dL (ref >39)
LDL Calculated: 67 mg/dL (ref 0–99)
Triglycerides: 71 mg/dL (ref 0–149)
VLDL Cholesterol Cal: 14 mg/dL (ref 5–40)

## 2017-08-04 LAB — HEPATIC FUNCTION PANEL
ALT: 12 IU/L (ref 0–44)
AST: 18 IU/L (ref 0–40)
Albumin: 4.3 g/dL (ref 3.5–4.8)
Alkaline Phosphatase: 65 IU/L (ref 39–117)
BILIRUBIN TOTAL: 0.6 mg/dL (ref 0.0–1.2)
BILIRUBIN, DIRECT: 0.19 mg/dL (ref 0.00–0.40)
TOTAL PROTEIN: 6.5 g/dL (ref 6.0–8.5)

## 2017-08-04 NOTE — Addendum Note (Signed)
Addended by: Kathyrn Lass on: 08/04/2017 09:32 AM   Modules accepted: Orders

## 2017-08-04 NOTE — Patient Instructions (Signed)
Continue your current therapy  We will check lab work today  I will see you in one year.

## 2017-09-18 ENCOUNTER — Other Ambulatory Visit: Payer: Self-pay | Admitting: Cardiology

## 2017-09-18 DIAGNOSIS — I2510 Atherosclerotic heart disease of native coronary artery without angina pectoris: Secondary | ICD-10-CM

## 2017-09-21 ENCOUNTER — Telehealth: Payer: Self-pay | Admitting: Cardiology

## 2017-09-21 MED ORDER — ATORVASTATIN CALCIUM 40 MG PO TABS: 40.0000 mg | ORAL_TABLET | Freq: Every day | ORAL | 3 refills | Status: DC

## 2017-09-21 NOTE — Telephone Encounter (Signed)
New message      *STAT* If patient is at the pharmacy, call can be transferred to refill team.   1. Which medications need to be refilled? (please list name of each medication and dose if known)   atorvastatin (LIPITOR) 40 MG tablet Take 1 tablet (40 mg total) by mouth daily.    2. Which pharmacy/location (including street and city if local pharmacy) is medication to be sent to [facility]   3. Do they need a 30 day or 90 day supply?  [place]

## 2018-04-12 ENCOUNTER — Encounter: Payer: Self-pay | Admitting: Family Medicine

## 2018-04-12 ENCOUNTER — Ambulatory Visit (INDEPENDENT_AMBULATORY_CARE_PROVIDER_SITE_OTHER): Payer: Medicare Other | Admitting: Family Medicine

## 2018-04-12 VITALS — BP 120/72 | HR 58 | Temp 98.0°F | Ht 71.25 in | Wt 223.2 lb

## 2018-04-12 DIAGNOSIS — I2510 Atherosclerotic heart disease of native coronary artery without angina pectoris: Secondary | ICD-10-CM

## 2018-04-12 DIAGNOSIS — M7022 Olecranon bursitis, left elbow: Secondary | ICD-10-CM | POA: Insufficient documentation

## 2018-04-12 DIAGNOSIS — Z23 Encounter for immunization: Secondary | ICD-10-CM | POA: Diagnosis not present

## 2018-04-12 NOTE — Progress Notes (Signed)
BP 120/72 (BP Location: Right Arm, Patient Position: Sitting, Cuff Size: Normal)   Pulse (!) 58   Temp 98 F (36.7 C) (Oral)   Ht 5' 11.25" (1.81 m)   Wt 223 lb 4 oz (101.3 kg)   SpO2 97%   BMI 30.92 kg/m    CC: L elbow swelling Subjective:    Patient ID: Zachary Liu, male    DOB: 03-11-1941, 77 y.o.   MRN: [id number]  HPI: Zachary Liu is a 77 y.o. male presenting on 04/12/2018 for Joint Swelling (C/o swelling on left elbow. Noticed 1 mo ago. Denies any pain and has ROM. )   Last seen 07/2015.  Increased golfing this past year.   1 mo h/o L elbow swelling at bursa. Denies inciting trauma/injury. No pain or tenderness, fevers, redness or warmth. Denies inciting trauma/injury. No change in size over the past month.   Relevant past medical, surgical, family and social history reviewed and updated as indicated. Interim medical history since our last visit reviewed. Allergies and medications reviewed and updated. Outpatient Medications Prior to Visit  Medication Sig Dispense Refill  . aspirin 81 MG tablet Take 81 mg by mouth daily.    Marland Kitchen atorvastatin (LIPITOR) 40 MG tablet Take 1 tablet (40 mg total) by mouth daily. 90 tablet 3  . clopidogrel (PLAVIX) 75 MG tablet TAKE 1 TABLET DAILY 90 tablet 3  . metoprolol tartrate (LOPRESSOR) 25 MG tablet TAKE 1 TABLET TWICE A DAY. 180 tablet 3  . Multiple Vitamin (MULTIVITAMIN) tablet Take 1 tablet by mouth daily.    . nitroGLYCERIN (NITROSTAT) 0.4 MG SL tablet Place 1 tablet (0.4 mg total) under the tongue every 5 (five) minutes as needed for chest pain. 25 tablet 2  . ramipril (ALTACE) 2.5 MG capsule TAKE 1 CAPSULE DAILY 90 capsule 3   No facility-administered medications prior to visit.      Per HPI unless specifically indicated in ROS section below Review of Systems     Objective:    BP 120/72 (BP Location: Right Arm, Patient Position: Sitting, Cuff Size: Normal)   Pulse (!) 58   Temp 98 F (36.7 C) (Oral)   Ht 5'  11.25" (1.81 m)   Wt 223 lb 4 oz (101.3 kg)   SpO2 97%   BMI 30.92 kg/m   Wt Readings from Last 3 Encounters:  04/12/18 223 lb 4 oz (101.3 kg)  08/04/17 220 lb 6.4 oz (100 kg)  07/12/16 220 lb 8 oz (100 kg)    Physical Exam  Constitutional: He appears well-developed and well-nourished. No distress.  Musculoskeletal: Normal range of motion. He exhibits no edema.  FROM bilateral elbows without pain No pain to palpation at L elbow L olecranon bursitis without redness, erythema or tenderness  Nursing note and vitals reviewed.  L olecranon bursitis aspiration IC obtained and in chart.  Area prepped with betadine, anesthesia with ethyl chloride. 1.5 in 22g needle attached to 20cc syringe used to aspirate fluid.  12cc of serosanguinous appearing synovial fluid aspirated, not cloudy.  Fluid not sent for analysis.  Pt tolerated procedure well. Dressed in compression bandage.     Assessment & Plan:   Problem List Items Addressed This Visit    Olecranon bursitis, left elbow - Primary    Discussed options - pt requests aspiration. Reviewed risks and benefits of procedure. L olecranon bursal aspirated today. Compression wrap applied. After care instructions reviewed. If recurrent, return for repeat aspiration vs ortho referral to discuss bursectomy.  Pt agrees.        Other Visit Diagnoses    Need for influenza vaccination       Relevant Orders   Flu Vaccine QUAD 36+ mos IM (Completed)       No orders of the defined types were placed in this encounter.  Orders Placed This Encounter  Procedures  . Flu Vaccine QUAD 36+ mos IM    Follow up plan: Return if symptoms worsen or fail to improve.  Ria Bush, MD

## 2018-04-12 NOTE — Patient Instructions (Addendum)
Olecranon bursitis aspirated today. Flu shot today  Elbow Bursitis Elbow bursitis is inflammation of the fluid-filled sac (bursa) between the tip of your elbow bone (olecranon) and your skin. Elbow bursitis may also be called olecranon bursitis. Normally, the olecranon bursa has only a small amount of fluid in it to cushion and protect your elbow bone. Elbow bursitis causes fluid to build up inside the bursa. Over time, this swelling and inflammation can cause pain when you bend or lean on your elbow. What are the causes? Elbow bursitis may be caused by:  Elbow injury (acute trauma).  Leaning on hard surfaces for long periods of time.  Infection from an injury that breaks the skin near your elbow.  A bone growth (spur) that forms at the tip of your elbow.  A medical condition that causes inflammation in your body, such as gout or rheumatoid arthritis.  The cause may also be unknown. What are the signs or symptoms? The first sign of elbow bursitis is usually swelling over the tip of your elbow. This can grow to be the size of a golf ball. This may start suddenly or develop gradually. You may also have:  Pain when bending or leaning on your elbow.  Restricted movement of your elbow.  If your bursitis is caused by an infection, symptoms may also include:  Redness, warmth, and tenderness of the elbow.  Drainage of pus from the swollen area over your elbow, if the skin breaks open.  How is this diagnosed? Your health care provider may be able to diagnose elbow bursitis based on your signs and symptoms, especially if you have recently been injured. Your health care provider will also do a physical exam. This may include:  X-rays to look for a bone spur or a bone fracture.  Draining fluid from the bursa to test it for infection.  Blood tests to rule out gout or rheumatoid arthritis.  How is this treated? Treatment for elbow bursitis depends on the cause. Treatment may  include:  Medicines. These may include: ? Over-the-counter medicines to relieve pain and inflammation. ? Antibiotic medicines to fight infection. ? Injections of anti-inflammatory medicines (steroids).  Wrapping your elbow with a bandage.  Draining fluid from the bursa.  Wearing elbow pads.  If your bursitis does not get better with treatment, surgery may be needed to remove the bursa. Follow these instructions at home:  Take medicines only as directed by your health care provider.  If you were prescribed an antibiotic medicine, finish all of it even if you start to feel better.  If your bursitis is caused by an injury, rest your elbow and wear your bandage as directed by your health care provider. You may alsoapply ice to the injured area as directed by your health care provider: ? Put ice in a plastic bag. ? Place a towel between your skin and the bag. ? Leave the ice on for 20 minutes, 2-3 times per day.  Avoid any activities that cause elbow pain.  Use elbow pads or elbow wraps to cushion your elbow. Contact a health care provider if:  You have a fever.  Your symptoms do not get better with treatment.  Your pain or swelling gets worse.  Your elbow pain or swelling goes away and then returns.  You have drainage of pus from the swollen area over your elbow. This information is not intended to replace advice given to you by your health care provider. Make sure you discuss any questions you  have with your health care provider. Document Released: 07/21/2006 Document Revised: 11/27/2015 Document Reviewed: 02/27/2014 Elsevier Interactive Patient Education  [employer].

## 2018-04-12 NOTE — Assessment & Plan Note (Addendum)
Discussed options - pt requests aspiration. Reviewed risks and benefits of procedure. L olecranon bursal aspirated today. Compression wrap applied. After care instructions reviewed. If recurrent, return for repeat aspiration vs ortho referral to discuss bursectomy. Pt agrees.

## 2018-08-11 NOTE — Progress Notes (Signed)
Zachary Liu Date of Birth: 15-Mar-1941   History of Present Illness: Zachary Liu is seen today for followup CAD. He is status post anterior myocardial infarction in September 2010. He had fairly extensive stenting of the proximal and mid LAD. He also had a long stent placed in the proximal right coronary. He had a follow up Myoview study in April 2015 showed fixed inferior and apical defects. No ischemia. EF 43%.  On follow up today he states he has been feeling very well. He remains very  active[facility].  He denies any  chest pain, shortness of breath, or palpitations.  He denies any dizziness or fatigue. Does bruise easily.  Allergies as of 08/15/2018   No Known Allergies     Medication List       Accurate as of August 15, 2018  9:18 AM. Always use your most recent med list.        aspirin 81 MG tablet Take 81 mg by mouth daily.   atorvastatin 40 MG tablet Commonly known as:  LIPITOR Take 1 tablet (40 mg total) by mouth daily.   clopidogrel 75 MG tablet Commonly known as:  PLAVIX TAKE 1 TABLET DAILY   metoprolol tartrate 25 MG tablet Commonly known as:  LOPRESSOR TAKE 1 TABLET TWICE A DAY.   multivitamin tablet Take 1 tablet by mouth daily.   nitroGLYCERIN 0.4 MG SL tablet Commonly known as:  NITROSTAT Place 1 tablet (0.4 mg total) under the tongue every 5 (five) minutes as needed for chest pain.   ramipril 2.5 MG capsule Commonly known as:  ALTACE TAKE 1 CAPSULE DAILY       No Known Allergies  Past Medical History:  Diagnosis Date  . Coronary artery disease   . GI bleed    due to a Mallory Weiss tear during heart attack  . History of anemia    after GIB from [facility] tear  . History of chicken pox   . HTN (hypertension)    pt denies  . Hyperlipidemia   . Ischemic cardiomyopathy   . Myocardial infarction of anterior wall greater than eight weeks ago   . 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 never  treated.     Past Surgical History:  Procedure Laterality Date  . CARDIAC CATHETERIZATION  04/18/2009   with stenting/successful intracoronary stenting of the proximal rt coronary artery with a drug-cluting stent    . CARDIAC CATHETERIZATION  03/09/2009   EF30-35%/severe 3-vessel obstructive atherosclerotic coronary artery disease,this is culprit/total occlusion of the proximal LAD/high-grade stenosis in the proximal rt coronary artery/subsequently noted there was some mod to severe stenosis in the mid LAD/successful intracoronary stenting of the proximal&mid LAD using drug-eluting stents/severe lt ventricular dysfunction  . ESOPHAGOGASTRODUODENOSCOPY  03/10/2009   Mallory-Weiss tears, one of them was actively bleeding/these were injected with epinephrine with control of hemorrhage  . HERNIA REPAIR Left 2013   inguinal hernia ([place])  . INGUINAL HERNIA REPAIR Right 06/05/2015   Procedure: OPEN RIGHT INGUINAL HERNIA REPAIR WITH MESH;  Surgeon: Ralene Ok, MD;  Location: [place];  Service: General;  Laterality: Right;  . INSERTION OF MESH Right 06/05/2015   Procedure: INSERTION OF MESH;  Surgeon: Ralene Ok, MD;  Location: [place];  Service: General;  Laterality: Right;  . TONSILLECTOMY  1946    Social History   Tobacco Use  Smoking Status Former Smoker  . Last attempt to quit: 07/06/1983  .  Years since quitting: 35.1  Smokeless Tobacco Never Used    Social History   Substance and Sexual Activity  Alcohol Use No  . Alcohol/week: 0.0 standard drinks    Family History  Problem Relation Age of Onset  . Peripheral vascular disease Mother        bilateral leg amputations  . Hyperlipidemia Mother   . Stroke Mother   . Valvular heart disease Brother   . CAD Neg Hx   . Diabetes Neg Hx   . Cancer Neg Hx     Review of Systems: As noted in history of present illness. All other systems were reviewed and are negative.  Physical Exam: BP 112/72   Pulse (!) 50   Ht 6\' 1"   (1.854 m)   Wt 222 lb (100.7 kg)   BMI 29.29 kg/m  GENERAL:  Well appearing WM in NAD HEENT:  PERRL, EOMI, sclera are clear. Oropharynx is clear. NECK:  No jugular venous distention, carotid upstroke brisk and symmetric, no bruits, no thyromegaly or adenopathy LUNGS:  Clear to auscultation bilaterally CHEST:  Unremarkable HEART:  RRR,  PMI not displaced or sustained,S1 and S2 within normal limits, no S3, no S4: no clicks, no rubs, no murmurs ABD:  Soft, nontender. BS +, no masses or bruits. No hepatomegaly, no splenomegaly EXT:  2 + pulses throughout, no edema, no cyanosis no clubbing SKIN:  Warm and dry.  No rashes NEURO:  Alert and oriented x 3. Cranial nerves II through XII intact. PSYCH:  Cognitively intact      LABORATORY DATA:  Lab Results  Component Value Date   WBC 6.7 06/05/2015   HGB 14.1 06/05/2015   HCT 43.4 06/05/2015   MCV 91.9 06/05/2015   PLT 188 06/05/2015     Chemistry      Component Value Date/Time   NA 142 08/04/2017 0940   K 4.6 08/04/2017 0940   CL 103 08/04/2017 0940   CO2 24 08/04/2017 0940   BUN 26 08/04/2017 0940   CREATININE 1.23 08/04/2017 0940   CREATININE 1.15 07/15/2016 1000      Component Value Date/Time   CALCIUM 9.4 08/04/2017 0940   ALKPHOS 65 08/04/2017 0940   AST 18 08/04/2017 0940   ALT 12 08/04/2017 0940   BILITOT 0.6 08/04/2017 0940     Lab Results  Component Value Date   CHOL 132 08/04/2017   CHOL 138 07/15/2016   CHOL 121 (L) 05/19/2015   Lab Results  Component Value Date   HDL 51 08/04/2017   HDL 51 07/15/2016   HDL 45 05/19/2015   Lab Results  Component Value Date   LDLCALC 67 08/04/2017   LDLCALC 69 07/15/2016   LDLCALC 62 05/19/2015   Lab Results  Component Value Date   TRIG 71 08/04/2017   TRIG 88 07/15/2016   TRIG 72 05/19/2015   Lab Results  Component Value Date   CHOLHDL 2.7 07/15/2016   CHOLHDL 2.7 05/19/2015   CHOLHDL 2.8 05/01/2014   No results found for: LDLDIRECT   ECG today   demonstrates sinus bradycardia rate 50, otherwise normal. No acute change.I have personally reviewed and interpreted this study.  Assessment / Plan: 1. Coronary disease with remote anterior myocardial infarction. Status post DES to the proximal and mid LAD. Later DES placement to the proximal RCA. Patient remains asymptomatic. Myoview study in April 2015 showed old scar but no ischemia.  He is completely asymptomatic.Continue current medical therapy. On long term DAPT.  2. Ischemic cardiomyopathy.  EF 43%. Asymptomatic. Continue ACEi and metoprolol.   3. Hypertension-controlled.  4. Hypercholesterolemia-Continue statin therapy. We will check fasting chemistries, lipids and CBC today.  I will follow up in one year.

## 2018-08-15 ENCOUNTER — Ambulatory Visit (INDEPENDENT_AMBULATORY_CARE_PROVIDER_SITE_OTHER): Payer: Medicare Other | Admitting: Cardiology

## 2018-08-15 ENCOUNTER — Encounter: Payer: Self-pay | Admitting: Cardiology

## 2018-08-15 ENCOUNTER — Encounter (INDEPENDENT_AMBULATORY_CARE_PROVIDER_SITE_OTHER): Payer: Self-pay

## 2018-08-15 VITALS — BP 112/72 | HR 50 | Ht 73.0 in | Wt 222.0 lb

## 2018-08-15 DIAGNOSIS — I2510 Atherosclerotic heart disease of native coronary artery without angina pectoris: Secondary | ICD-10-CM

## 2018-08-15 DIAGNOSIS — E7800 Pure hypercholesterolemia, unspecified: Secondary | ICD-10-CM | POA: Diagnosis not present

## 2018-08-15 DIAGNOSIS — I10 Essential (primary) hypertension: Secondary | ICD-10-CM | POA: Diagnosis not present

## 2018-08-15 MED ORDER — NITROGLYCERIN 0.4 MG SL SUBL: 0.4000 mg | SUBLINGUAL_TABLET | SUBLINGUAL | 2 refills | Status: DC | PRN

## 2018-08-17 ENCOUNTER — Telehealth: Payer: Self-pay | Admitting: Cardiology

## 2018-08-17 LAB — CBC WITH DIFFERENTIAL/PLATELET
BASOS ABS: 0.1 10*3/uL (ref 0.0–0.2)
Basos: 1 %
EOS (ABSOLUTE): 0.2 10*3/uL (ref 0.0–0.4)
Eos: 2 %
HEMATOCRIT: 43.3 % (ref 37.5–51.0)
HEMOGLOBIN: 14.2 g/dL (ref 13.0–17.7)
IMMATURE GRANS (ABS): 0 10*3/uL (ref 0.0–0.1)
Immature Granulocytes: 0 %
LYMPHS ABS: 1.4 10*3/uL (ref 0.7–3.1)
LYMPHS: 21 %
MCH: 29.2 pg (ref 26.6–33.0)
MCHC: 32.8 g/dL (ref 31.5–35.7)
MCV: 89 fL (ref 79–97)
MONOCYTES: 7 %
Monocytes Absolute: 0.5 10*3/uL (ref 0.1–0.9)
NEUTROS ABS: 4.9 10*3/uL (ref 1.4–7.0)
Neutrophils: 69 %
Platelets: 230 10*3/uL (ref 150–450)
RBC: 4.86 x10E6/uL (ref 4.14–5.80)
RDW: 13.1 % (ref 11.6–15.4)
WBC: 7 10*3/uL (ref 3.4–10.8)

## 2018-08-17 LAB — BASIC METABOLIC PANEL
BUN/Creatinine Ratio: 11 (ref 10–24)
BUN: 14 mg/dL (ref 8–27)
CO2: 23 mmol/L (ref 20–29)
CREATININE: 1.31 mg/dL — AB (ref 0.76–1.27)
Calcium: 9.3 mg/dL (ref 8.6–10.2)
Chloride: 103 mmol/L (ref 96–106)
GFR calc Af Amer: 60 mL/min/{1.73_m2} (ref >59)
GFR, EST NON AFRICAN AMERICAN: 52 mL/min/{1.73_m2} — AB (ref >59)
GLUCOSE: 93 mg/dL (ref 65–99)
Potassium: 4.4 mmol/L (ref 3.5–5.2)
Sodium: 139 mmol/L (ref 134–144)

## 2018-08-17 LAB — LIPID PANEL
CHOL/HDL RATIO: 2.8 ratio (ref 0.0–5.0)
CHOLESTEROL TOTAL: 145 mg/dL (ref 100–199)
HDL: 51 mg/dL (ref >39)
LDL CALC: 76 mg/dL (ref 0–99)
TRIGLYCERIDES: 91 mg/dL (ref 0–149)
VLDL Cholesterol Cal: 18 mg/dL (ref 5–40)

## 2018-08-17 LAB — HEPATIC FUNCTION PANEL
ALT: 14 IU/L (ref 0–44)
AST: 19 IU/L (ref 0–40)
Albumin: 4.2 g/dL (ref 3.7–4.7)
Alkaline Phosphatase: 66 IU/L (ref 39–117)
BILIRUBIN TOTAL: 0.7 mg/dL (ref 0.0–1.2)
Bilirubin, Direct: 0.18 mg/dL (ref 0.00–0.40)
Total Protein: 6.3 g/dL (ref 6.0–8.5)

## 2018-08-17 NOTE — Telephone Encounter (Signed)
Pt updated with lab results

## 2018-08-17 NOTE — Telephone Encounter (Signed)
New Message    Patient returning [name]'s call about lab work.

## 2018-09-12 ENCOUNTER — Other Ambulatory Visit: Payer: Self-pay | Admitting: Cardiology

## 2018-09-12 DIAGNOSIS — I2510 Atherosclerotic heart disease of native coronary artery without angina pectoris: Secondary | ICD-10-CM

## 2019-02-16 ENCOUNTER — Other Ambulatory Visit: Payer: Self-pay | Admitting: Cardiology

## 2019-02-16 DIAGNOSIS — I2510 Atherosclerotic heart disease of native coronary artery without angina pectoris: Secondary | ICD-10-CM

## 2019-03-06 DIAGNOSIS — C44519 Basal cell carcinoma of skin of other part of trunk: Secondary | ICD-10-CM | POA: Diagnosis not present

## 2019-03-06 DIAGNOSIS — L814 Other melanin hyperpigmentation: Secondary | ICD-10-CM | POA: Diagnosis not present

## 2019-03-06 DIAGNOSIS — C44712 Basal cell carcinoma of skin of right lower limb, including hip: Secondary | ICD-10-CM | POA: Diagnosis not present

## 2019-03-06 DIAGNOSIS — L821 Other seborrheic keratosis: Secondary | ICD-10-CM | POA: Diagnosis not present

## 2019-03-06 DIAGNOSIS — Z85828 Personal history of other malignant neoplasm of skin: Secondary | ICD-10-CM | POA: Diagnosis not present

## 2019-04-03 DIAGNOSIS — Z85828 Personal history of other malignant neoplasm of skin: Secondary | ICD-10-CM | POA: Diagnosis not present

## 2019-04-03 DIAGNOSIS — C44519 Basal cell carcinoma of skin of other part of trunk: Secondary | ICD-10-CM | POA: Diagnosis not present

## 2019-04-05 ENCOUNTER — Ambulatory Visit (INDEPENDENT_AMBULATORY_CARE_PROVIDER_SITE_OTHER): Payer: Medicare Other

## 2019-04-05 DIAGNOSIS — Z23 Encounter for immunization: Secondary | ICD-10-CM | POA: Diagnosis not present

## 2019-05-06 DIAGNOSIS — C4491 Basal cell carcinoma of skin, unspecified: Secondary | ICD-10-CM

## 2019-05-06 HISTORY — DX: Basal cell carcinoma of skin, unspecified: C44.91

## 2019-05-16 DIAGNOSIS — Z85828 Personal history of other malignant neoplasm of skin: Secondary | ICD-10-CM | POA: Diagnosis not present

## 2019-05-16 DIAGNOSIS — C44519 Basal cell carcinoma of skin of other part of trunk: Secondary | ICD-10-CM | POA: Diagnosis not present

## 2019-05-16 DIAGNOSIS — C44719 Basal cell carcinoma of skin of left lower limb, including hip: Secondary | ICD-10-CM | POA: Diagnosis not present

## 2019-05-16 DIAGNOSIS — L570 Actinic keratosis: Secondary | ICD-10-CM | POA: Diagnosis not present

## 2019-05-16 DIAGNOSIS — L814 Other melanin hyperpigmentation: Secondary | ICD-10-CM | POA: Diagnosis not present

## 2019-05-16 DIAGNOSIS — C44712 Basal cell carcinoma of skin of right lower limb, including hip: Secondary | ICD-10-CM | POA: Diagnosis not present

## 2019-05-16 DIAGNOSIS — C44612 Basal cell carcinoma of skin of right upper limb, including shoulder: Secondary | ICD-10-CM | POA: Diagnosis not present

## 2019-05-16 DIAGNOSIS — L821 Other seborrheic keratosis: Secondary | ICD-10-CM | POA: Diagnosis not present

## 2019-05-22 ENCOUNTER — Encounter: Payer: Self-pay | Admitting: Family Medicine

## 2019-06-07 MED FILL — MUPIROCIN 2% OINTMENT: 2 | 30 days supply | Qty: 22 | Fill #1

## 2019-08-16 ENCOUNTER — Encounter: Payer: Self-pay | Admitting: Family Medicine

## 2019-08-16 DIAGNOSIS — L814 Other melanin hyperpigmentation: Secondary | ICD-10-CM | POA: Diagnosis not present

## 2019-08-16 DIAGNOSIS — L821 Other seborrheic keratosis: Secondary | ICD-10-CM | POA: Diagnosis not present

## 2019-08-16 DIAGNOSIS — C44519 Basal cell carcinoma of skin of other part of trunk: Secondary | ICD-10-CM | POA: Diagnosis not present

## 2019-08-16 DIAGNOSIS — Z85828 Personal history of other malignant neoplasm of skin: Secondary | ICD-10-CM | POA: Diagnosis not present

## 2019-08-16 DIAGNOSIS — C44619 Basal cell carcinoma of skin of left upper limb, including shoulder: Secondary | ICD-10-CM | POA: Diagnosis not present

## 2019-08-16 DIAGNOSIS — L570 Actinic keratosis: Secondary | ICD-10-CM | POA: Diagnosis not present

## 2019-08-16 DIAGNOSIS — C44612 Basal cell carcinoma of skin of right upper limb, including shoulder: Secondary | ICD-10-CM | POA: Diagnosis not present

## 2019-08-17 NOTE — Progress Notes (Signed)
Zachary Liu Date of Birth: 1941/02/13   History of Present Illness: Zachary Liu is seen today for followup CAD. He is status post anterior myocardial infarction in September 2010. He had fairly extensive stenting of the proximal and mid LAD. He also had a long stent placed in the proximal right coronary. He had a follow up Myoview study in April 2015 showed fixed inferior and apical defects. No ischemia. EF 43%.  Is activity has been less during the pandemic but he is still walking regularly. Weight is stable.   He denies any  chest pain, shortness of breath, or palpitations.  He denies any dizziness or fatigue.   Allergies as of 08/21/2019   No Known Allergies     Medication List       Accurate as of August 21, 2019  3:25 PM. If you have any questions, ask your nurse or doctor.        aspirin 81 MG tablet Take 81 mg by mouth daily.   atorvastatin 40 MG tablet Commonly known as: LIPITOR Take 1 tablet (40 mg total) by mouth daily at 6 PM. What changed: See the new instructions. Changed by: Melisia Leming [place], MD   clopidogrel 75 MG tablet Commonly known as: PLAVIX Take 1 tablet (75 mg total) by mouth daily.   metoprolol tartrate 25 MG tablet Commonly known as: LOPRESSOR Take 1 tablet (25 mg total) by mouth 2 (two) times daily.   multivitamin tablet Take 1 tablet by mouth daily.   nitroGLYCERIN 0.4 MG SL tablet Commonly known as: Nitrostat Place 1 tablet (0.4 mg total) under the tongue every 5 (five) minutes as needed for chest pain.   ramipril 2.5 MG capsule Commonly known as: ALTACE Take 1 capsule (2.5 mg total) by mouth daily.       No Known Allergies  Past Medical History:  Diagnosis Date  . Basal cell carcinoma 05/2019   multiple - R arm, R chest, B tibia ([place])  . Coronary artery disease   . GI bleed    due to a Mallory Weiss tear during heart attack  . History of anemia    after GIB from [facility] tear  . History of chicken pox   . HTN  (hypertension)    pt denies  . Hyperlipidemia   . Ischemic cardiomyopathy   . Myocardial infarction of anterior wall greater than eight weeks ago   . 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 never treated.     Past Surgical History:  Procedure Laterality Date  . CARDIAC CATHETERIZATION  04/18/2009   with stenting/successful intracoronary stenting of the proximal rt coronary artery with a drug-cluting stent    . CARDIAC CATHETERIZATION  03/09/2009   EF30-35%/severe 3-vessel obstructive atherosclerotic coronary artery disease,this is culprit/total occlusion of the proximal LAD/high-grade stenosis in the proximal rt coronary artery/subsequently noted there was some mod to severe stenosis in the mid LAD/successful intracoronary stenting of the proximal&mid LAD using drug-eluting stents/severe lt ventricular dysfunction  . ESOPHAGOGASTRODUODENOSCOPY  03/10/2009   Mallory-Weiss tears, one of them was actively bleeding/these were injected with epinephrine with control of hemorrhage  . HERNIA REPAIR Left 2013   inguinal hernia ([place])  . INGUINAL HERNIA REPAIR Right 06/05/2015   Procedure: OPEN RIGHT INGUINAL HERNIA REPAIR WITH MESH;  Surgeon: Ralene Ok, MD;  Location: [place];  Service: General;  Laterality: Right;  . INSERTION OF MESH Right 06/05/2015   Procedure: INSERTION OF MESH;  Surgeon: Ralene Ok, MD;  Location:  [place] [facility];  Service: General;  Laterality: Right;  . TONSILLECTOMY  1946    Social History   Tobacco Use  Smoking Status Former Smoker  . Quit date: 07/06/1983  . Years since quitting: 36.1  Smokeless Tobacco Never Used    Social History   Substance and Sexual Activity  Alcohol Use No  . Alcohol/week: 0.0 standard drinks    Family History  Problem Relation Age of Onset  . Peripheral vascular disease Mother        bilateral leg amputations  . Hyperlipidemia Mother   . Stroke Mother   . Valvular heart disease Brother   . CAD Neg Hx   . Diabetes Neg Hx    . Cancer Neg Hx     Review of Systems: As noted in history of present illness. All other systems were reviewed and are negative.  Physical Exam: BP 130/80   Pulse (!) 56   Temp (!) 97.5 F (36.4 C)   Ht 6' (1.829 m)   Wt 215 lb (97.5 kg)   SpO2 98%   BMI 29.16 kg/m  GENERAL:  Well appearing WM in NAD HEENT:  PERRL, EOMI, sclera are clear. Oropharynx is clear. NECK:  No jugular venous distention, carotid upstroke brisk and symmetric, no bruits, no thyromegaly or adenopathy LUNGS:  Clear to auscultation bilaterally CHEST:  Unremarkable HEART:  RRR,  PMI not displaced or sustained,S1 and S2 within normal limits, no S3, no S4: no clicks, no rubs, no murmurs ABD:  Soft, nontender. BS +, no masses or bruits. No hepatomegaly, no splenomegaly EXT:  2 + pulses throughout, no edema, no cyanosis no clubbing SKIN:  Warm and dry.  No rashes NEURO:  Alert and oriented x 3. Cranial nerves II through XII intact. PSYCH:  Cognitively intact      LABORATORY DATA:  Lab Results  Component Value Date   WBC 7.0 08/15/2018   HGB 14.2 08/15/2018   HCT 43.3 08/15/2018   MCV 89 08/15/2018   PLT 230 08/15/2018     Chemistry      Component Value Date/Time   NA 139 08/15/2018 0927   K 4.4 08/15/2018 0927   CL 103 08/15/2018 0927   CO2 23 08/15/2018 0927   BUN 14 08/15/2018 0927   CREATININE 1.31 (H) 08/15/2018 0927   CREATININE 1.15 07/15/2016 1000      Component Value Date/Time   CALCIUM 9.3 08/15/2018 0927   ALKPHOS 66 08/15/2018 0927   AST 19 08/15/2018 0927   ALT 14 08/15/2018 0927   BILITOT 0.7 08/15/2018 0927     Lab Results  Component Value Date   CHOL 145 08/15/2018   CHOL 132 08/04/2017   CHOL 138 07/15/2016   Lab Results  Component Value Date   HDL 51 08/15/2018   HDL 51 08/04/2017   HDL 51 07/15/2016   Lab Results  Component Value Date   LDLCALC 76 08/15/2018   LDLCALC 67 08/04/2017   LDLCALC 69 07/15/2016   Lab Results  Component Value Date   TRIG 91  08/15/2018   TRIG 71 08/04/2017   TRIG 88 07/15/2016   Lab Results  Component Value Date   CHOLHDL 2.8 08/15/2018   CHOLHDL 2.7 07/15/2016   CHOLHDL 2.7 05/19/2015   No results found for: LDLDIRECT   ECG today  demonstrates sinus bradycardia rate 56. LAD. No acute change.I have personally reviewed and interpreted this study.  Assessment / Plan: 1. Coronary disease with remote anterior myocardial infarction in 2010.  Status post DES to the proximal and mid LAD. Later DES placement to the proximal RCA. Patient remains asymptomatic. Myoview study in April 2015 showed old scar but no ischemia.  He is completely asymptomatic.Continue current medical therapy. On long term DAPT.  2. Ischemic cardiomyopathy. EF 43%. Asymptomatic. Continue ACEi and metoprolol.   3. Hypertension-controlled.  4. Hypercholesterolemia-Continue statin therapy. We will check fasting chemistries, lipids and CBC today.  I will follow up in one year.

## 2019-08-21 ENCOUNTER — Encounter: Payer: Self-pay | Admitting: Cardiology

## 2019-08-21 ENCOUNTER — Other Ambulatory Visit: Payer: Self-pay

## 2019-08-21 ENCOUNTER — Ambulatory Visit (INDEPENDENT_AMBULATORY_CARE_PROVIDER_SITE_OTHER): Payer: Medicare Other | Admitting: Cardiology

## 2019-08-21 VITALS — BP 130/80 | HR 56 | Temp 97.5°F | Ht 72.0 in | Wt 215.0 lb

## 2019-08-21 DIAGNOSIS — I10 Essential (primary) hypertension: Secondary | ICD-10-CM

## 2019-08-21 DIAGNOSIS — I2510 Atherosclerotic heart disease of native coronary artery without angina pectoris: Secondary | ICD-10-CM

## 2019-08-21 DIAGNOSIS — E7800 Pure hypercholesterolemia, unspecified: Secondary | ICD-10-CM

## 2019-08-21 MED ORDER — NITROGLYCERIN 0.4 MG SL SUBL: 0.4000 mg | SUBLINGUAL_TABLET | SUBLINGUAL | 2 refills | Status: AC | PRN

## 2019-08-21 MED ORDER — ATORVASTATIN CALCIUM 40 MG PO TABS: 40.0000 mg | ORAL_TABLET | Freq: Every day | ORAL | 3 refills | Status: DC

## 2019-08-21 MED ORDER — METOPROLOL TARTRATE 25 MG PO TABS: 25.0000 mg | ORAL_TABLET | Freq: Two times a day (BID) | ORAL | 3 refills | Status: DC

## 2019-08-21 MED ORDER — CLOPIDOGREL BISULFATE 75 MG PO TABS: 75.0000 mg | ORAL_TABLET | Freq: Every day | ORAL | 3 refills | Status: DC

## 2019-08-21 MED ORDER — RAMIPRIL 2.5 MG PO CAPS: 2.5000 mg | ORAL_CAPSULE | Freq: Every day | ORAL | 3 refills | Status: DC

## 2019-08-22 ENCOUNTER — Encounter: Payer: Self-pay | Admitting: Family Medicine

## 2019-08-22 LAB — BASIC METABOLIC PANEL
BUN/Creatinine Ratio: 13 (ref 10–24)
BUN: 16 mg/dL (ref 8–27)
CO2: 23 mmol/L (ref 20–29)
Calcium: 9.1 mg/dL (ref 8.6–10.2)
Chloride: 101 mmol/L (ref 96–106)
Creatinine, Ser: 1.28 mg/dL — ABNORMAL HIGH (ref 0.76–1.27)
GFR calc Af Amer: 61 mL/min/{1.73_m2} (ref >59)
GFR calc non Af Amer: 53 mL/min/{1.73_m2} — ABNORMAL LOW (ref >59)
Glucose: 86 mg/dL (ref 65–99)
Potassium: 4.5 mmol/L (ref 3.5–5.2)
Sodium: 139 mmol/L (ref 134–144)

## 2019-08-22 LAB — HEPATIC FUNCTION PANEL
ALT: 13 IU/L (ref 0–44)
AST: 17 IU/L (ref 0–40)
Albumin: 4.1 g/dL (ref 3.7–4.7)
Alkaline Phosphatase: 65 IU/L (ref 39–117)
Bilirubin Total: 0.6 mg/dL (ref 0.0–1.2)
Bilirubin, Direct: 0.2 mg/dL (ref 0.00–0.40)
Total Protein: 6 g/dL (ref 6.0–8.5)

## 2019-08-22 LAB — CBC WITH DIFFERENTIAL/PLATELET
Basophils Absolute: 0.1 10*3/uL (ref 0.0–0.2)
Basos: 1 %
EOS (ABSOLUTE): 0.2 10*3/uL (ref 0.0–0.4)
Eos: 2 %
Hematocrit: 44.8 % (ref 37.5–51.0)
Hemoglobin: 14.9 g/dL (ref 13.0–17.7)
Immature Grans (Abs): 0 10*3/uL (ref 0.0–0.1)
Immature Granulocytes: 0 %
Lymphocytes Absolute: 1.6 10*3/uL (ref 0.7–3.1)
Lymphs: 26 %
MCH: 29.3 pg (ref 26.6–33.0)
MCHC: 33.3 g/dL (ref 31.5–35.7)
MCV: 88 fL (ref 79–97)
Monocytes Absolute: 0.5 10*3/uL (ref 0.1–0.9)
Monocytes: 7 %
Neutrophils Absolute: 3.9 10*3/uL (ref 1.4–7.0)
Neutrophils: 64 %
Platelets: 211 10*3/uL (ref 150–450)
RBC: 5.09 x10E6/uL (ref 4.14–5.80)
RDW: 12.7 % (ref 11.6–15.4)
WBC: 6.2 10*3/uL (ref 3.4–10.8)

## 2019-08-22 LAB — LIPID PANEL
Chol/HDL Ratio: 2.7 ratio (ref 0.0–5.0)
Cholesterol, Total: 130 mg/dL (ref 100–199)
HDL: 48 mg/dL (ref >39)
LDL Chol Calc (NIH): 65 mg/dL (ref 0–99)
Triglycerides: 86 mg/dL (ref 0–149)
VLDL Cholesterol Cal: 17 mg/dL (ref 5–40)

## 2019-12-12 DIAGNOSIS — D225 Melanocytic nevi of trunk: Secondary | ICD-10-CM | POA: Diagnosis not present

## 2019-12-12 DIAGNOSIS — L814 Other melanin hyperpigmentation: Secondary | ICD-10-CM | POA: Diagnosis not present

## 2019-12-12 DIAGNOSIS — L821 Other seborrheic keratosis: Secondary | ICD-10-CM | POA: Diagnosis not present

## 2019-12-12 DIAGNOSIS — L570 Actinic keratosis: Secondary | ICD-10-CM | POA: Diagnosis not present

## 2019-12-12 DIAGNOSIS — Z85828 Personal history of other malignant neoplasm of skin: Secondary | ICD-10-CM | POA: Diagnosis not present

## 2019-12-12 DIAGNOSIS — C44519 Basal cell carcinoma of skin of other part of trunk: Secondary | ICD-10-CM | POA: Diagnosis not present

## 2019-12-12 DIAGNOSIS — D692 Other nonthrombocytopenic purpura: Secondary | ICD-10-CM | POA: Diagnosis not present

## 2019-12-12 DIAGNOSIS — C44319 Basal cell carcinoma of skin of other parts of face: Secondary | ICD-10-CM | POA: Diagnosis not present

## 2020-01-09 MED FILL — DOXYCYCLINE HYCLATE 100 MG: 100 | 5 days supply | Qty: 10 | Fill #0

## 2020-03-29 DIAGNOSIS — Z23 Encounter for immunization: Secondary | ICD-10-CM | POA: Diagnosis not present

## 2020-05-13 DIAGNOSIS — Z85828 Personal history of other malignant neoplasm of skin: Secondary | ICD-10-CM | POA: Diagnosis not present

## 2020-05-13 DIAGNOSIS — C44612 Basal cell carcinoma of skin of right upper limb, including shoulder: Secondary | ICD-10-CM | POA: Diagnosis not present

## 2020-05-13 DIAGNOSIS — C44712 Basal cell carcinoma of skin of right lower limb, including hip: Secondary | ICD-10-CM | POA: Diagnosis not present

## 2020-05-13 DIAGNOSIS — L814 Other melanin hyperpigmentation: Secondary | ICD-10-CM | POA: Diagnosis not present

## 2020-05-13 DIAGNOSIS — C4441 Basal cell carcinoma of skin of scalp and neck: Secondary | ICD-10-CM | POA: Diagnosis not present

## 2020-05-13 DIAGNOSIS — L570 Actinic keratosis: Secondary | ICD-10-CM | POA: Diagnosis not present

## 2020-05-13 DIAGNOSIS — L821 Other seborrheic keratosis: Secondary | ICD-10-CM | POA: Diagnosis not present

## 2020-05-13 DIAGNOSIS — D692 Other nonthrombocytopenic purpura: Secondary | ICD-10-CM | POA: Diagnosis not present

## 2020-05-13 DIAGNOSIS — D225 Melanocytic nevi of trunk: Secondary | ICD-10-CM | POA: Diagnosis not present

## 2020-05-16 ENCOUNTER — Encounter: Payer: Self-pay | Admitting: Family Medicine

## 2020-06-13 DIAGNOSIS — Z1159 Encounter for screening for other viral diseases: Secondary | ICD-10-CM | POA: Diagnosis not present

## 2020-08-13 ENCOUNTER — Other Ambulatory Visit: Payer: Self-pay | Admitting: Cardiology

## 2020-08-13 DIAGNOSIS — I2510 Atherosclerotic heart disease of native coronary artery without angina pectoris: Secondary | ICD-10-CM

## 2020-08-30 NOTE — Progress Notes (Signed)
Zachary Liu Date of Birth: January 02, 1941   History of Present Illness: [name] is seen today for followup CAD. He is status post anterior myocardial infarction in September 2010. He had fairly extensive stenting of the proximal and mid LAD. He also had a long stent placed in the proximal right coronary. He had a follow up Myoview study in April 2015 showed fixed inferior and apical defects. No ischemia. EF 43%.  On follow up today he is doing very well. Weight is stable.   He denies any  chest pain, shortness of breath, or palpitations. He does walk regularly.   He denies any dizziness or fatigue. His wife is retiring this year and he is looking forward to more traveling.   Allergies as of 09/01/2020   No Known Allergies     Medication List       Accurate as of September 01, 2020  4:10 PM. If you have any questions, ask your nurse or doctor.        aspirin 81 MG tablet Take 81 mg by mouth daily.   atorvastatin 40 MG tablet Commonly known as: LIPITOR TAKE 1 TABLET DAILY AT 6PM   clopidogrel 75 MG tablet Commonly known as: PLAVIX TAKE 1 TABLET DAILY   metoprolol tartrate 25 MG tablet Commonly known as: LOPRESSOR Take 1 tablet (25 mg total) by mouth 2 (two) times daily.   multivitamin tablet Take 1 tablet by mouth daily.   nitroGLYCERIN 0.4 MG SL tablet Commonly known as: Nitrostat Place 1 tablet (0.4 mg total) under the tongue every 5 (five) minutes as needed for chest pain.   ramipril 2.5 MG capsule Commonly known as: ALTACE TAKE 1 CAPSULE DAILY       No Known Allergies  Past Medical History:  Diagnosis Date  . Basal cell carcinoma 05/2019, 08/2019, 05/2020   multiple - R arm, R chest, B tibia, forearm/tibia/scalp ([place])  . Coronary artery disease   . GI bleed    due to a Mallory Weiss tear during heart attack  . History of anemia    after GIB from [facility] tear  . History of chicken pox   . HTN (hypertension)    pt denies  . Hyperlipidemia   .  Ischemic cardiomyopathy   . Myocardial infarction of anterior wall greater than eight weeks ago   . 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 never treated.     Past Surgical History:  Procedure Laterality Date  . CARDIAC CATHETERIZATION  04/18/2009   with stenting/successful intracoronary stenting of the proximal rt coronary artery with a drug-cluting stent    . CARDIAC CATHETERIZATION  03/09/2009   EF30-35%/severe 3-vessel obstructive atherosclerotic coronary artery disease,this is culprit/total occlusion of the proximal LAD/high-grade stenosis in the proximal rt coronary artery/subsequently noted there was some mod to severe stenosis in the mid LAD/successful intracoronary stenting of the proximal&mid LAD using drug-eluting stents/severe lt ventricular dysfunction  . ESOPHAGOGASTRODUODENOSCOPY  03/10/2009   Mallory-Weiss tears, one of them was actively bleeding/these were injected with epinephrine with control of hemorrhage  . HERNIA REPAIR Left 2013   inguinal hernia ([place])  . INGUINAL HERNIA REPAIR Right 06/05/2015   Procedure: OPEN RIGHT INGUINAL HERNIA REPAIR WITH MESH;  Surgeon: Ralene Ok, MD;  Location: [place];  Service: General;  Laterality: Right;  . INSERTION OF MESH Right 06/05/2015   Procedure: INSERTION OF MESH;  Surgeon: Ralene Ok, MD;  Location: [place];  Service: General;  Laterality: Right;  .  TONSILLECTOMY  1946    Social History   Tobacco Use  Smoking Status Former Smoker  . Quit date: 07/06/1983  . Years since quitting: 37.1  Smokeless Tobacco Never Used    Social History   Substance and Sexual Activity  Alcohol Use No  . Alcohol/week: 0.0 standard drinks    Family History  Problem Relation Age of Onset  . Peripheral vascular disease Mother        bilateral leg amputations  . Hyperlipidemia Mother   . Stroke Mother   . Valvular heart disease Brother   . CAD Neg Hx   . Diabetes Neg Hx   . Cancer Neg Hx     Review of Systems: As noted  in history of present illness. All other systems were reviewed and are negative.  Physical Exam: BP 134/76   Pulse 68   Ht 6' (1.829 m)   Wt 219 lb 6.4 oz (99.5 kg)   SpO2 98%   BMI 29.76 kg/m  GENERAL:  Well appearing WM in NAD HEENT:  PERRL, EOMI, sclera are clear. Oropharynx is clear. NECK:  No jugular venous distention, carotid upstroke brisk and symmetric, no bruits, no thyromegaly or adenopathy LUNGS:  Clear to auscultation bilaterally CHEST:  Unremarkable HEART:  RRR,  PMI not displaced or sustained,S1 and S2 within normal limits, no S3, no S4: no clicks, no rubs, no murmurs ABD:  Soft, nontender. BS +, no masses or bruits. No hepatomegaly, no splenomegaly EXT:  2 + pulses throughout, no edema, no cyanosis no clubbing SKIN:  Warm and dry.  No rashes NEURO:  Alert and oriented x 3. Cranial nerves II through XII intact. PSYCH:  Cognitively intact      LABORATORY DATA:  Lab Results  Component Value Date   WBC 6.2 08/21/2019   HGB 14.9 08/21/2019   HCT 44.8 08/21/2019   MCV 88 08/21/2019   PLT 211 08/21/2019     Chemistry      Component Value Date/Time   NA 139 08/21/2019 1546   K 4.5 08/21/2019 1546   CL 101 08/21/2019 1546   CO2 23 08/21/2019 1546   BUN 16 08/21/2019 1546   CREATININE 1.28 (H) 08/21/2019 1546   CREATININE 1.15 07/15/2016 1000      Component Value Date/Time   CALCIUM 9.1 08/21/2019 1546   ALKPHOS 65 08/21/2019 1546   AST 17 08/21/2019 1546   ALT 13 08/21/2019 1546   BILITOT 0.6 08/21/2019 1546     Lab Results  Component Value Date   CHOL 130 08/21/2019   CHOL 145 08/15/2018   CHOL 132 08/04/2017   Lab Results  Component Value Date   HDL 48 08/21/2019   HDL 51 08/15/2018   HDL 51 08/04/2017   Lab Results  Component Value Date   LDLCALC 65 08/21/2019   LDLCALC 76 08/15/2018   LDLCALC 67 08/04/2017   Lab Results  Component Value Date   TRIG 86 08/21/2019   TRIG 91 08/15/2018   TRIG 71 08/04/2017   Lab Results  Component  Value Date   CHOLHDL 2.7 08/21/2019   CHOLHDL 2.8 08/15/2018   CHOLHDL 2.7 07/15/2016   No results found for: LDLDIRECT   ECG today  demonstrates NSR rate 68 with PVCs.  LAD. No acute change.I have personally reviewed and interpreted this study.  Assessment / Plan: 1. Coronary disease with remote anterior myocardial infarction in 2010. Status post DES to the proximal and mid LAD. Later DES placement to the proximal  RCA. Patient remains asymptomatic. Myoview study in April 2015 showed old scar but no ischemia.  He is completely asymptomatic.Continue current medical therapy. On long term DAPT.  2. Ischemic cardiomyopathy. EF 43%. Asymptomatic. Continue ACEi and metoprolol.   3. Hypertension-controlled.  4. Hypercholesterolemia-Continue statin therapy. We will check fasting chemistries, lipids and CBC today.  5. PVCs. Asymptomatic. Continue beta blocker  I will follow up in one year.

## 2020-09-01 ENCOUNTER — Ambulatory Visit (INDEPENDENT_AMBULATORY_CARE_PROVIDER_SITE_OTHER): Payer: Medicare Other | Admitting: Cardiology

## 2020-09-01 ENCOUNTER — Encounter: Payer: Self-pay | Admitting: Cardiology

## 2020-09-01 ENCOUNTER — Other Ambulatory Visit: Payer: Self-pay

## 2020-09-01 VITALS — BP 134/76 | HR 68 | Ht 72.0 in | Wt 219.4 lb

## 2020-09-01 DIAGNOSIS — I10 Essential (primary) hypertension: Secondary | ICD-10-CM

## 2020-09-01 DIAGNOSIS — I2510 Atherosclerotic heart disease of native coronary artery without angina pectoris: Secondary | ICD-10-CM

## 2020-09-01 DIAGNOSIS — E7800 Pure hypercholesterolemia, unspecified: Secondary | ICD-10-CM

## 2020-09-01 MED ORDER — METOPROLOL TARTRATE 25 MG PO TABS
25.0000 mg | ORAL_TABLET | Freq: Two times a day (BID) | ORAL | 3 refills | Status: DC
Start: 2020-09-01 — End: 2021-04-27

## 2020-09-02 LAB — CBC WITH DIFFERENTIAL/PLATELET
Basophils Absolute: 0.1 10*3/uL (ref 0.0–0.2)
Basos: 1 %
EOS (ABSOLUTE): 0.2 10*3/uL (ref 0.0–0.4)
Eos: 3 %
Hematocrit: 47.2 % (ref 37.5–51.0)
Hemoglobin: 15.5 g/dL (ref 13.0–17.7)
Immature Grans (Abs): 0.1 10*3/uL (ref 0.0–0.1)
Immature Granulocytes: 1 %
Lymphocytes Absolute: 1.3 10*3/uL (ref 0.7–3.1)
Lymphs: 21 %
MCH: 29.8 pg (ref 26.6–33.0)
MCHC: 32.8 g/dL (ref 31.5–35.7)
MCV: 91 fL (ref 79–97)
Monocytes Absolute: 0.4 10*3/uL (ref 0.1–0.9)
Monocytes: 6 %
Neutrophils Absolute: 4.2 10*3/uL (ref 1.4–7.0)
Neutrophils: 68 %
Platelets: 228 10*3/uL (ref 150–450)
RBC: 5.2 x10E6/uL (ref 4.14–5.80)
RDW: 12.9 % (ref 11.6–15.4)
WBC: 6.1 10*3/uL (ref 3.4–10.8)

## 2020-09-02 LAB — LIPID PANEL
Chol/HDL Ratio: 2.9 ratio (ref 0.0–5.0)
Cholesterol, Total: 154 mg/dL (ref 100–199)
HDL: 53 mg/dL (ref >39)
LDL Chol Calc (NIH): 83 mg/dL (ref 0–99)
Triglycerides: 95 mg/dL (ref 0–149)
VLDL Cholesterol Cal: 18 mg/dL (ref 5–40)

## 2020-09-02 LAB — BASIC METABOLIC PANEL
BUN/Creatinine Ratio: 10 (ref 10–24)
BUN: 12 mg/dL (ref 8–27)
CO2: 24 mmol/L (ref 20–29)
Calcium: 9.1 mg/dL (ref 8.6–10.2)
Chloride: 102 mmol/L (ref 96–106)
Creatinine, Ser: 1.21 mg/dL (ref 0.76–1.27)
Glucose: 101 mg/dL — ABNORMAL HIGH (ref 65–99)
Potassium: 4.2 mmol/L (ref 3.5–5.2)
Sodium: 141 mmol/L (ref 134–144)
eGFR: 61 mL/min/{1.73_m2} (ref >59)

## 2020-09-02 LAB — HEPATIC FUNCTION PANEL
ALT: 16 IU/L (ref 0–44)
AST: 22 IU/L (ref 0–40)
Albumin: 4.4 g/dL (ref 3.7–4.7)
Alkaline Phosphatase: 69 IU/L (ref 44–121)
Bilirubin Total: 0.4 mg/dL (ref 0.0–1.2)
Bilirubin, Direct: 0.14 mg/dL (ref 0.00–0.40)
Total Protein: 6.6 g/dL (ref 6.0–8.5)

## 2020-10-08 DIAGNOSIS — Z23 Encounter for immunization: Secondary | ICD-10-CM | POA: Diagnosis not present

## 2020-11-10 DIAGNOSIS — L570 Actinic keratosis: Secondary | ICD-10-CM | POA: Diagnosis not present

## 2020-11-10 DIAGNOSIS — D225 Melanocytic nevi of trunk: Secondary | ICD-10-CM | POA: Diagnosis not present

## 2020-11-10 DIAGNOSIS — C44519 Basal cell carcinoma of skin of other part of trunk: Secondary | ICD-10-CM | POA: Diagnosis not present

## 2020-11-10 DIAGNOSIS — L821 Other seborrheic keratosis: Secondary | ICD-10-CM | POA: Diagnosis not present

## 2020-11-10 DIAGNOSIS — L814 Other melanin hyperpigmentation: Secondary | ICD-10-CM | POA: Diagnosis not present

## 2020-11-10 DIAGNOSIS — C44619 Basal cell carcinoma of skin of left upper limb, including shoulder: Secondary | ICD-10-CM | POA: Diagnosis not present

## 2020-11-10 DIAGNOSIS — D692 Other nonthrombocytopenic purpura: Secondary | ICD-10-CM | POA: Diagnosis not present

## 2020-11-10 DIAGNOSIS — Z85828 Personal history of other malignant neoplasm of skin: Secondary | ICD-10-CM | POA: Diagnosis not present

## 2020-11-17 ENCOUNTER — Encounter: Payer: Self-pay | Admitting: Family Medicine

## 2021-03-05 DIAGNOSIS — U071 COVID-19: Secondary | ICD-10-CM

## 2021-03-05 HISTORY — DX: COVID-19: U07.1

## 2021-03-11 ENCOUNTER — Telehealth: Payer: Self-pay

## 2021-03-11 DIAGNOSIS — Z1152 Encounter for screening for COVID-19: Secondary | ICD-10-CM | POA: Diagnosis not present

## 2021-03-11 DIAGNOSIS — U071 COVID-19: Secondary | ICD-10-CM | POA: Diagnosis not present

## 2021-03-11 NOTE — Telephone Encounter (Signed)
Unable to reach pt on any contact # for DPR or Emergencies. Left v/m requesting cb to see if pt was seen at [facility] or ED. Sending note to Dr Darnell Level and [facility] traige and Lattie Haw CMA. Sending teams to [place]. Last time saw Dr Darnell Level was 04/12/2018.

## 2021-03-11 NOTE — Telephone Encounter (Signed)
[place] [facility] - Client TELEPHONE ADVICE RECORD [facility] Patient Name: Zachary Liu Gender: Male DOB: May 20, 1941 Age: 80 Y 93 M 13 D Return Phone Number: [phone] (Primary), [phone] (Secondary) Address: City/ State/ Zip: [place] [address] Client [facility] - Client Client Site [place] Physician Ria Bush - MD Contact Type Call Who Is Calling Patient / Member / Family / Caregiver Call Type Triage / Clinical Caller Name AMY Hegel Relationship To Patient Spouse Return Phone Number [phone] (Primary) Chief Complaint Cough Reason for Call Symptomatic / Request for [place] states patient tested positive for COVID. She states he has a cough and runny nose. [place] Not Listed ED Translation No Nurse Assessment Nurse: Rodney Cruise, RN, Sean Date/Time (Eastern Time): 03/11/2021 12:36:15 PM Confirm and document reason for call. If symptomatic, describe symptoms. ---Caller states client is COVID positive, symptoms started Monday night, is a little short of breath. Currently chest pain when coughing. Mucinex for symptoms. Does the patient have any new or worsening symptoms? ---Yes Will a triage be completed? ---Yes Related visit to physician within the last 2 weeks? ---No Does the PT have any chronic conditions? (i.e. diabetes, asthma, this includes High risk factors for pregnancy, etc.) ---Yes List chronic conditions. ---MI, Hyperlipidemia. Is this a behavioral health or substance abuse call? ---No Guidelines Guideline Title Affirmed Question Affirmed Notes Nurse Date/Time (Eastern Time) COVID-19 - Diagnosed or Suspected MODERATE difficulty breathing (e.g., speaks in phrases, SOB even at rest, pulse 100-120) Baxter, RN, Sean 03/11/2021 12:41:08 PM Disp. Time Eilene Ghazi Time) Disposition Final User PLEASE NOTE: All  timestamps contained within this report are represented as [place] Standard Time. CONFIDENTIALTY NOTICE: This fax transmission is intended only for the addressee. It contains information that is legally privileged, confidential or otherwise protected from use or disclosure. If you are not the intended recipient, you are strictly prohibited from reviewing, disclosing, copying using or disseminating any of this information or taking any action in reliance on or regarding this information. If you have received this fax in error, please notify us immediately by telephone so that we can arrange for its return to [place]. Phone: [phone], Toll-Free: [phone], Fax: [phone] Page: 2 of 2 Call Id: [id number] 03/11/2021 12:43:51 PM Go to ED Now Yes Rodney Cruise, RN, Lillia Dallas Disagree/Comply Comply Caller Understands Yes PreDisposition InappropriateToAsk Care Advice Given Per Guideline GO TO ED NOW: CALL EMS 911 IF: * Confusion occurs. CARE ADVICE given per COVID-19 - DIAGNOSED OR SUSPECTED (Adult) guideline. Referrals GO TO FACILITY OTHER - SPECIFY

## 2021-03-12 ENCOUNTER — Encounter: Payer: Self-pay | Admitting: Family Medicine

## 2021-03-12 NOTE — Telephone Encounter (Signed)
I spoke with pts wife; (DPR signed) they are presently at their 2nd home in [place] and pt was seen at [facility] there in [place] and GFR was 59 and pt was given lower dose paxlovid 150:100 mg dose pak. Pt started med on 03/11/21. Pt was also given an inhaler but pt has not had to use yet.Pt does not have fever this morning. Pt is in no distress and pt and his wife plan to return by car for 8 hr trip to home  locally today. Self quarantine, drink plenty of fluids, rest, and take Tylenol for fever. UC & ED precautions given and pt's wife voiced understanding. Sending note to DR Baldwin Crown CMA.

## 2021-03-12 NOTE — Telephone Encounter (Signed)
Lvm asking pt to call back.  Need to relay Dr. G's message.  

## 2021-03-12 NOTE — Telephone Encounter (Signed)
I was unable to reach pts wife on phone and left v/m for pts wife to call [facility].sending note to triage and Lattie Haw CMA.

## 2021-03-12 NOTE — Telephone Encounter (Signed)
Noted. Thank you.  Hopefully he feels better quickly.  Would have wife test herself if symptoms develop and let us know if positive.

## 2021-03-16 NOTE — Telephone Encounter (Signed)
Lvm asking pt to call back.  Need to relay Dr. G's message.  

## 2021-03-19 NOTE — Telephone Encounter (Signed)
Lvm asking pt to call back.  Need to relay Dr. Synthia Innocent message.  Looks like pt's wife had video visit with Dr. Glori Bickers on 03/16/21.

## 2021-04-10 DIAGNOSIS — Z23 Encounter for immunization: Secondary | ICD-10-CM | POA: Diagnosis not present

## 2021-04-25 ENCOUNTER — Other Ambulatory Visit: Payer: Self-pay | Admitting: Cardiology

## 2021-05-13 ENCOUNTER — Encounter: Payer: Self-pay | Admitting: Family Medicine

## 2021-05-13 DIAGNOSIS — L570 Actinic keratosis: Secondary | ICD-10-CM | POA: Diagnosis not present

## 2021-05-13 DIAGNOSIS — C44519 Basal cell carcinoma of skin of other part of trunk: Secondary | ICD-10-CM | POA: Diagnosis not present

## 2021-05-13 DIAGNOSIS — L821 Other seborrheic keratosis: Secondary | ICD-10-CM | POA: Diagnosis not present

## 2021-05-13 DIAGNOSIS — D225 Melanocytic nevi of trunk: Secondary | ICD-10-CM | POA: Diagnosis not present

## 2021-05-13 DIAGNOSIS — L814 Other melanin hyperpigmentation: Secondary | ICD-10-CM | POA: Diagnosis not present

## 2021-05-13 DIAGNOSIS — D692 Other nonthrombocytopenic purpura: Secondary | ICD-10-CM | POA: Diagnosis not present

## 2021-05-13 DIAGNOSIS — C44619 Basal cell carcinoma of skin of left upper limb, including shoulder: Secondary | ICD-10-CM | POA: Diagnosis not present

## 2021-05-13 DIAGNOSIS — C44612 Basal cell carcinoma of skin of right upper limb, including shoulder: Secondary | ICD-10-CM | POA: Diagnosis not present

## 2021-05-13 DIAGNOSIS — Z85828 Personal history of other malignant neoplasm of skin: Secondary | ICD-10-CM | POA: Diagnosis not present

## 2021-05-13 DIAGNOSIS — C44719 Basal cell carcinoma of skin of left lower limb, including hip: Secondary | ICD-10-CM | POA: Diagnosis not present

## 2021-06-08 DIAGNOSIS — Z23 Encounter for immunization: Secondary | ICD-10-CM | POA: Diagnosis not present

## 2021-06-11 ENCOUNTER — Other Ambulatory Visit (HOSPITAL_COMMUNITY): Payer: Self-pay

## 2021-06-11 DIAGNOSIS — Z85828 Personal history of other malignant neoplasm of skin: Secondary | ICD-10-CM | POA: Diagnosis not present

## 2021-06-11 DIAGNOSIS — C44619 Basal cell carcinoma of skin of left upper limb, including shoulder: Secondary | ICD-10-CM | POA: Diagnosis not present

## 2021-06-11 MED ORDER — DOXYCYCLINE HYCLATE 100 MG PO CAPS
100.0000 mg | ORAL_CAPSULE | Freq: Two times a day (BID) | ORAL | 0 refills | Status: DC
  Filled 2021-06-11: qty 10, 5d supply, fill #0

## 2021-08-01 ENCOUNTER — Other Ambulatory Visit: Payer: Self-pay | Admitting: Cardiology

## 2021-08-01 DIAGNOSIS — I2510 Atherosclerotic heart disease of native coronary artery without angina pectoris: Secondary | ICD-10-CM

## 2021-08-24 ENCOUNTER — Encounter (HOSPITAL_BASED_OUTPATIENT_CLINIC_OR_DEPARTMENT_OTHER): Payer: Self-pay | Admitting: Emergency Medicine

## 2021-08-24 ENCOUNTER — Emergency Department (HOSPITAL_BASED_OUTPATIENT_CLINIC_OR_DEPARTMENT_OTHER)
Admission: EM | Admit: 2021-08-24 | Discharge: 2021-08-24 | Disposition: A | Discharge status: Home / Self Care | Payer: Medicare Other | Attending: Emergency Medicine | Admitting: Emergency Medicine

## 2021-08-24 ENCOUNTER — Other Ambulatory Visit: Payer: Self-pay

## 2021-08-24 ENCOUNTER — Emergency Department (HOSPITAL_BASED_OUTPATIENT_CLINIC_OR_DEPARTMENT_OTHER): Payer: Medicare Other

## 2021-08-24 ENCOUNTER — Other Ambulatory Visit (HOSPITAL_BASED_OUTPATIENT_CLINIC_OR_DEPARTMENT_OTHER): Payer: Self-pay

## 2021-08-24 DIAGNOSIS — R0981 Nasal congestion: Secondary | ICD-10-CM | POA: Diagnosis present

## 2021-08-24 DIAGNOSIS — I2510 Atherosclerotic heart disease of native coronary artery without angina pectoris: Secondary | ICD-10-CM | POA: Diagnosis not present

## 2021-08-24 DIAGNOSIS — Z20822 Contact with and (suspected) exposure to covid-19: Secondary | ICD-10-CM | POA: Insufficient documentation

## 2021-08-24 DIAGNOSIS — J069 Acute upper respiratory infection, unspecified: Secondary | ICD-10-CM | POA: Insufficient documentation

## 2021-08-24 DIAGNOSIS — Z79899 Other long term (current) drug therapy: Secondary | ICD-10-CM | POA: Insufficient documentation

## 2021-08-24 DIAGNOSIS — Z7902 Long term (current) use of antithrombotics/antiplatelets: Secondary | ICD-10-CM | POA: Insufficient documentation

## 2021-08-24 DIAGNOSIS — Z7982 Long term (current) use of aspirin: Secondary | ICD-10-CM | POA: Insufficient documentation

## 2021-08-24 LAB — RESP PANEL BY RT-PCR (FLU A&B, COVID) ARPGX2
Influenza A by PCR: NEGATIVE
Influenza B by PCR: NEGATIVE
SARS Coronavirus 2 by RT PCR: NEGATIVE

## 2021-08-24 MED ORDER — DEXAMETHASONE 4 MG PO TABS
10.0000 mg | ORAL_TABLET | Freq: Once | ORAL | Status: AC
  Administered 2021-08-24: 10 mg via ORAL
  Filled 2021-08-24: qty 3

## 2021-08-24 MED ORDER — AZITHROMYCIN 250 MG PO TABS
250.0000 mg | ORAL_TABLET | Freq: Every day | ORAL | 0 refills | Status: DC
  Filled 2021-08-24: qty 6, 5d supply, fill #0

## 2021-08-24 NOTE — ED Provider Notes (Signed)
[facility] EMERGENCY DEPT Provider Note   CSN: [id number] Arrival date & time: 08/24/21  2703     History  Chief Complaint  Patient presents with   Cough    Zachary Liu is a 81 y.o. male.  The history is provided by the patient.  Cough Cough characteristics:  Non-productive Sputum characteristics:  Nondescript Severity:  Mild Onset quality:  Gradual Duration:  4 days Timing:  Constant Progression:  Unchanged Chronicity:  New Context: sick contacts and upper respiratory infection   Relieved by:  Nothing Worsened by:  Nothing Associated symptoms: sinus congestion   Associated symptoms: no chest pain, no chills, no diaphoresis, no ear fullness, no ear pain, no eye discharge, no fever, no headaches, no myalgias, no rash, no rhinorrhea, no shortness of breath, no sore throat, no weight loss and no wheezing       Home Medications Prior to Admission medications   Medication Sig Start Date End Date Taking? Authorizing Provider  azithromycin (ZITHROMAX) 250 MG tablet Take 1 tablet (250 mg total) by mouth daily. Take first 2 tablets together, then 1 every day until finished. 08/24/21  Yes Zulay Corrie, DO  aspirin 81 MG tablet Take 81 mg by mouth daily.    [provider]  atorvastatin (LIPITOR) 40 MG tablet Take 1 tablet (40 mg total) by mouth daily. 08/03/21   Martinique, Peter M, MD  clopidogrel (PLAVIX) 75 MG tablet Take 1 tablet (75 mg total) by mouth daily. 08/03/21   Martinique, Peter M, MD  doxycycline (VIBRAMYCIN) 100 MG capsule Take 1 capsule (100 mg total) by mouth 2 (two) times daily with a meal 06/11/21     metoprolol tartrate (LOPRESSOR) 25 MG tablet TAKE 1 TABLET TWICE A DAY 04/27/21   Martinique, Peter M, MD  Multiple Vitamin (MULTIVITAMIN) tablet Take 1 tablet by mouth daily.    [provider]  nitroGLYCERIN (NITROSTAT) 0.4 MG SL tablet Place 1 tablet (0.4 mg total) under the tongue every 5 (five) minutes as needed for chest pain.  08/21/19   Martinique, Peter M, MD  ramipril (ALTACE) 2.5 MG capsule Take 1 capsule (2.5 mg total) by mouth daily. 08/03/21   Martinique, Peter M, MD      Allergies    Patient has no known allergies.    Review of Systems   Review of Systems  Constitutional:  Negative for chills, diaphoresis, fever and weight loss.  HENT:  Negative for ear pain, rhinorrhea and sore throat.   Eyes:  Negative for discharge.  Respiratory:  Positive for cough. Negative for shortness of breath and wheezing.   Cardiovascular:  Negative for chest pain.  Musculoskeletal:  Negative for myalgias.  Skin:  Negative for rash.  Neurological:  Negative for headaches.   Physical Exam Updated Vital Signs BP 110/75 (BP Location: Right Arm)    Pulse 73    Temp 98.3 F (36.8 C) (Oral)    Resp 17    Ht 6\' 1"  (1.854 m)    Wt 83.9 kg    SpO2 100%    BMI 24.41 kg/m  Physical Exam Vitals and nursing note reviewed.  Constitutional:      General: He is not in acute distress.    Appearance: He is well-developed. He is not ill-appearing.  HENT:     Head: Normocephalic and atraumatic.     Nose: Congestion present.     Mouth/Throat:     Mouth: Mucous membranes are moist.  Eyes:     Conjunctiva/sclera: Conjunctivae  normal.     Pupils: Pupils are equal, round, and reactive to light.  Cardiovascular:     Rate and Rhythm: Normal rate and regular rhythm.     Pulses: Normal pulses.     Heart sounds: Normal heart sounds. No murmur heard. Pulmonary:     Effort: Pulmonary effort is normal. No respiratory distress.     Breath sounds: Normal breath sounds.  Abdominal:     Palpations: Abdomen is soft.     Tenderness: There is no abdominal tenderness.  Musculoskeletal:        General: No swelling.     Cervical back: Normal range of motion and neck supple.  Skin:    General: Skin is warm and dry.     Capillary Refill: Capillary refill takes less than 2 seconds.  Neurological:     General: No focal deficit present.     Mental Status:  He is alert.  Psychiatric:        Mood and Affect: Mood normal.    ED Results / Procedures / Treatments   Labs (all labs ordered are listed, but only abnormal results are displayed) Labs Reviewed  RESP PANEL BY RT-PCR (FLU A&B, COVID) [id number]    EKG EKG Interpretation  Date/Time:  Monday August 24 2021 07:31:27 EST Ventricular Rate:  70 PR Interval:  149 QRS Duration: 88 QT Interval:  372 QTC Calculation: 402 R Axis:   -32 Text Interpretation: Sinus rhythm Confirmed by Lennice Sites ([id number]) on 08/24/2021 7:41:40 AM  Radiology DG Chest Portable 1 View  Result Date: 08/24/2021 CLINICAL DATA:  An 81 year old male presents with cough. EXAM: PORTABLE CHEST 1 VIEW COMPARISON:  March 10, 2009. FINDINGS: EKG leads project over the chest.  Trachea midline. Cardiomediastinal contours and hilar structures are normal. Lungs are clear. On limited assessment no acute skeletal process. IMPRESSION: No acute cardiopulmonary disease. Electronically Signed   By: Zetta Bills M.D.   On: 08/24/2021 07:55    Procedures Procedures    Medications Ordered in ED Medications  dexamethasone (DECADRON) tablet 10 mg (10 mg Oral Given 08/24/21 0745)    ED Course/ Medical Decision Making/ A&P                           Medical Decision Making Amount and/or Complexity of Data Reviewed Radiology: ordered.  Risk Prescription drug management.   Zachary Liu is an 81 year old male with history of CAD who presents the ED with cough and congestion.  Normal vitals.  No fever.  Symptoms for the last 4 to 5 days.  Recent sick contact.  Recent travel as well.  Viral type symptoms including cough and congestion.  Fairly nonproductive.  Has lost his voice a little bit as well.  He has clear breath sounds.  Nasal congestion.  Hoarse voice but no obvious redness to his throat or throat swelling.  Sounds like differential diagnosis is viral process including bronchitis versus laryngitis.  Will swab for  COVID and influenza.  Will obtain a chest x-ray to evaluate for pneumonia.  EKG done in triage per my interpretation and review shows sinus tachycardia with normal rate.  No ischemic changes.  I have no concern for acute coronary syndrome or other cardiac or pulmonary process.  Per my review and interpretation of the chest x-ray there is no evidence of pneumonia.  Overall suspect viral process.  Discharged in good condition.  Patient very well-appearing.  No concern for sepsis.  This  chart was dictated using voice recognition software.  Despite best efforts to proofread,  errors can occur which can change the documentation meaning.         Final Clinical Impression(s) / ED Diagnoses Final diagnoses:  Upper respiratory tract infection, unspecified type    Rx / DC Orders ED Discharge Orders          Ordered    azithromycin (ZITHROMAX) 250 MG tablet  Daily        08/24/21 0758              Lennice Sites, DO 08/24/21 [phone]

## 2021-08-24 NOTE — ED Triage Notes (Signed)
Pt reports cough, sore throat and congestion for 3-4 days. Denies CP or SOB. Endorses coughing up brown and yellow mucus.

## 2021-08-24 NOTE — Discharge Instructions (Addendum)
Follow-up COVID and influenza testing on your MyChart.  This should be available later this morning.  Overall suspect that you have a viral process.  You have been treated with a long-acting steroid.  I have also provided you antibiotic to help with your symptoms as well.  Return if symptoms worsen.  Chest x-ray today showed no evidence of pneumonia.  Overall this is upper respiratory viral infection.  Symptoms should improve here over the next several days.

## 2021-09-09 NOTE — Progress Notes (Signed)
? ?Zachary Liu ?Date of Birth: 24-Jul-1940 ? ? ?History of Present Illness: ?Zachary Liu is seen today for followup CAD. He is status post anterior myocardial infarction in September 2010. He had fairly extensive stenting of the proximal and mid LAD. He also had a long stent placed in the proximal right coronary. He had a follow up Myoview study in April 2015 showed fixed inferior and apical defects. No ischemia. EF 43%. ? ?On follow up today he is doing very well. Weight is stable.   He denies any  chest pain, shortness of breath, or palpitations. He does walk regularly. He is now participating in an exercise class with his wife.   He denies any dizziness or fatigue. His wife is retiring this year and he is looking forward to more traveling. He has lost about 30 lbs.  ? ?Allergies as of 09/14/2021   ?No Known Allergies ?  ? ?  ?Medication List  ?  ? ?  ? Accurate as of September 14, 2021  2:49 PM. If you have any questions, ask your nurse or doctor.  ?  ?  ? ?  ? ?aspirin 81 MG tablet ?Take 81 mg by mouth daily. ?  ?atorvastatin 40 MG tablet ?Commonly known as: LIPITOR ?Take 1 tablet (40 mg total) by mouth daily. ?  ?azithromycin 250 MG tablet ?Commonly known as: ZITHROMAX ?Take first 2 tablets together on day 1, then take 1 tablet by  mouth every day until finished. ?  ?clopidogrel 75 MG tablet ?Commonly known as: PLAVIX ?Take 1 tablet (75 mg total) by mouth daily. ?  ?doxycycline 100 MG capsule ?Commonly known as: VIBRAMYCIN ?Take 1 capsule (100 mg total) by mouth 2 (two) times daily with a meal ?  ?metoprolol tartrate 25 MG tablet ?Commonly known as: LOPRESSOR ?TAKE 1 TABLET TWICE A DAY ?  ?multivitamin tablet ?Take 1 tablet by mouth daily. ?  ?nitroGLYCERIN 0.4 MG SL tablet ?Commonly known as: Nitrostat ?Place 1 tablet (0.4 mg total) under the tongue every 5 (five) minutes as needed for chest pain. ?  ?ramipril 2.5 MG capsule ?Commonly known as: ALTACE ?Take 1 capsule (2.5 mg total) by mouth daily. ?  ? ?   ? ? ?No Known Allergies ? ?Past Medical History:  ?Diagnosis Date  ? Basal cell carcinoma 05/2019, 08/2019, 05/2020, 11/2020  ? multiple - R arm, R chest, B tibia, forearm/tibia/scalp ([place])  ? Coronary artery disease   ? COVID-19 virus infection 03/2021  ? GI bleed   ? due to a Mallory Weiss tear during heart attack  ? History of anemia   ? after GIB from [facility] tear  ? History of chicken pox   ? HTN (hypertension)   ? pt denies  ? Hyperlipidemia   ? Ischemic cardiomyopathy   ? Myocardial infarction of anterior wall greater than eight weeks ago   ? 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 followed by CXR. never treated.   ? ? ?Past Surgical History:  ?Procedure Laterality Date  ? CARDIAC CATHETERIZATION  04/18/2009  ? with stenting/successful intracoronary stenting of the proximal rt coronary artery with a drug-cluting stent    ? CARDIAC CATHETERIZATION  03/09/2009  ? EF30-35%/severe 3-vessel obstructive atherosclerotic coronary artery disease,this is culprit/total occlusion of the proximal LAD/high-grade stenosis in the proximal rt coronary artery/subsequently noted there was some mod to severe stenosis in the mid LAD/successful intracoronary stenting of the proximal&mid LAD using drug-eluting stents/severe lt ventricular dysfunction  ? ESOPHAGOGASTRODUODENOSCOPY  03/10/2009  ?  Mallory-Weiss tears, one of them was actively bleeding/these were injected with epinephrine with control of hemorrhage  ? HERNIA REPAIR Left 2013  ? inguinal hernia ([place])  ? INGUINAL HERNIA REPAIR Right 06/05/2015  ? Procedure: OPEN RIGHT INGUINAL HERNIA REPAIR WITH MESH;  Surgeon: Ralene Ok, MD;  Location: [facility];  Service: General;  Laterality: Right;  ? INSERTION OF MESH Right 06/05/2015  ? Procedure: INSERTION OF MESH;  Surgeon: Ralene Ok, MD;  Location: [facility];  Service: General;  Laterality: Right;  ? TONSILLECTOMY  1946  ? ? ?Social History  ? ?Tobacco Use  ?Smoking Status Former  ? Types: Cigarettes  ? Quit date: 07/06/1983  ? Years  since quitting: 38.2  ?Smokeless Tobacco Never  ? ? ?Social History  ? ?Substance and Sexual Activity  ?Alcohol Use No  ? Alcohol/week: 0.0 standard drinks  ? ? ?Family History  ?Problem Relation Age of [place]  ? Peripheral vascular disease Mother   ?     bilateral leg amputations  ? Hyperlipidemia Mother   ? Stroke Mother   ? Valvular heart disease Brother   ? CAD Neg Hx   ? Diabetes Neg Hx   ? Cancer Neg Hx   ? ? ?Review of Systems: ?As noted in history of present illness. All other systems were reviewed and are negative. ? ?Physical Exam: ?BP 100/62   Pulse (!) 53   Ht 6' (1.829 m)   Wt 188 lb 6.4 oz (85.5 kg)   SpO2 97%   BMI 25.55 kg/m?  ?GENERAL:  Well appearing WM in NAD ?HEENT:  PERRL, EOMI, sclera are clear. Oropharynx is clear. ?NECK:  No jugular venous distention, carotid upstroke brisk and symmetric, no bruits, no thyromegaly or adenopathy ?LUNGS:  Clear to auscultation bilaterally ?CHEST:  Unremarkable ?HEART:  RRR,  PMI not displaced or sustained,S1 and S2 within normal limits, no S3, no S4: no clicks, no rubs, no murmurs ?ABD:  Soft, nontender. BS +, no masses or bruits. No hepatomegaly, no splenomegaly ?EXT:  2 + pulses throughout, no edema, no cyanosis no clubbing ?SKIN:  Warm and dry.  No rashes ?NEURO:  Alert and oriented x 3. Cranial nerves II through XII intact. ?PSYCH:  Cognitively intact ? ?LABORATORY DATA: ? ?Lab Results  ?Component Value Date  ? WBC 6.1 09/01/2020  ? HGB 15.5 09/01/2020  ? HCT 47.2 09/01/2020  ? MCV 91 09/01/2020  ? PLT 228 09/01/2020  ? ?  Chemistry   ?   ?Component Value Date/Time  ? NA 141 09/01/2020 1609  ? K 4.2 09/01/2020 1609  ? CL 102 09/01/2020 1609  ? CO2 24 09/01/2020 1609  ? BUN 12 09/01/2020 1609  ? CREATININE 1.21 09/01/2020 1609  ? CREATININE 1.15 07/15/2016 1000  ?    ?Component Value Date/Time  ? CALCIUM 9.1 09/01/2020 1609  ? ALKPHOS 69 09/01/2020 1609  ? AST 22 09/01/2020 1609  ? ALT 16 09/01/2020 1609  ? BILITOT 0.4 09/01/2020 1609  ?  ? ?Lab Results   ?Component Value Date  ? CHOL 154 09/01/2020  ? CHOL 130 08/21/2019  ? CHOL 145 08/15/2018  ? ?Lab Results  ?Component Value Date  ? HDL 53 09/01/2020  ? HDL 48 08/21/2019  ? HDL 51 08/15/2018  ? ?Lab Results  ?Component Value Date  ? [place] 83 09/01/2020  ? [place] 65 08/21/2019  ? [place] 76 08/15/2018  ? ?Lab Results  ?Component Value Date  ? TRIG 95 09/01/2020  ? TRIG 86 08/21/2019  ?  TRIG 91 08/15/2018  ? ?Lab Results  ?Component Value Date  ? CHOLHDL 2.9 09/01/2020  ? CHOLHDL 2.7 08/21/2019  ? CHOLHDL 2.8 08/15/2018  ? ?No results found for: LDLDIRECT  ? ?ECG today  demonstrates NSR rate 53.  LAD. No acute change.I have personally reviewed and interpreted this study. ? ?Assessment / Plan: ?1. Coronary disease with remote anterior myocardial infarction in 2010. Status post DES to the proximal and mid LAD. Later DES placement to the proximal RCA. Patient remains asymptomatic. Myoview study in April 2015 showed old scar but no ischemia.  He is completely asymptomatic.Continue current medical therapy. On long term DAPT. ? ?2. Ischemic cardiomyopathy. EF 43%. Asymptomatic. Continue ACEi and metoprolol.  ? ?3. Hypertension-controlled. ? ?4. Hypercholesterolemia-Continue statin therapy. Will follow up labs.  ? ?5. PVCs. Asymptomatic. Continue beta blocker ? ?I will follow up in one year. ? ? ?

## 2021-09-14 ENCOUNTER — Ambulatory Visit (INDEPENDENT_AMBULATORY_CARE_PROVIDER_SITE_OTHER): Payer: Medicare Other | Admitting: Cardiology

## 2021-09-14 ENCOUNTER — Encounter: Payer: Self-pay | Admitting: Cardiology

## 2021-09-14 ENCOUNTER — Other Ambulatory Visit: Payer: Self-pay

## 2021-09-14 VITALS — BP 100/62 | HR 53 | Ht 72.0 in | Wt 188.4 lb

## 2021-09-14 DIAGNOSIS — I10 Essential (primary) hypertension: Secondary | ICD-10-CM | POA: Diagnosis not present

## 2021-09-14 DIAGNOSIS — E7800 Pure hypercholesterolemia, unspecified: Secondary | ICD-10-CM | POA: Diagnosis not present

## 2021-09-14 DIAGNOSIS — I2510 Atherosclerotic heart disease of native coronary artery without angina pectoris: Secondary | ICD-10-CM

## 2021-09-14 MED ORDER — ATORVASTATIN CALCIUM 40 MG PO TABS: 40.0000 mg | ORAL_TABLET | Freq: Every day | ORAL | 3 refills | Status: DC

## 2021-09-14 MED ORDER — RAMIPRIL 2.5 MG PO CAPS: 2.5000 mg | ORAL_CAPSULE | Freq: Every day | ORAL | 3 refills | Status: DC

## 2021-09-14 MED ORDER — CLOPIDOGREL BISULFATE 75 MG PO TABS: 75.0000 mg | ORAL_TABLET | Freq: Every day | ORAL | 3 refills | Status: DC

## 2021-09-15 LAB — HEPATIC FUNCTION PANEL
ALT: 12 IU/L (ref 0–44)
AST: 20 IU/L (ref 0–40)
Albumin: 4.1 g/dL (ref 3.6–4.6)
Alkaline Phosphatase: 66 IU/L (ref 44–121)
Bilirubin Total: 0.7 mg/dL (ref 0.0–1.2)
Bilirubin, Direct: 0.19 mg/dL (ref 0.00–0.40)
Total Protein: 6.4 g/dL (ref 6.0–8.5)

## 2021-09-15 LAB — LIPID PANEL
Chol/HDL Ratio: 2.8 ratio (ref 0.0–5.0)
Cholesterol, Total: 143 mg/dL (ref 100–199)
HDL: 52 mg/dL (ref >39)
LDL Chol Calc (NIH): 70 mg/dL (ref 0–99)
Triglycerides: 114 mg/dL (ref 0–149)
VLDL Cholesterol Cal: 21 mg/dL (ref 5–40)

## 2021-09-15 LAB — BASIC METABOLIC PANEL
BUN/Creatinine Ratio: 14 (ref 10–24)
BUN: 16 mg/dL (ref 8–27)
CO2: 25 mmol/L (ref 20–29)
Calcium: 9.7 mg/dL (ref 8.6–10.2)
Chloride: 104 mmol/L (ref 96–106)
Creatinine, Ser: 1.13 mg/dL (ref 0.76–1.27)
Glucose: 97 mg/dL (ref 70–99)
Potassium: 4.9 mmol/L (ref 3.5–5.2)
Sodium: 142 mmol/L (ref 134–144)
eGFR: 65 mL/min/{1.73_m2} (ref >59)

## 2021-10-13 ENCOUNTER — Encounter: Payer: Self-pay | Admitting: Family Medicine

## 2022-05-20 ENCOUNTER — Other Ambulatory Visit: Payer: Self-pay | Admitting: Cardiology

## 2022-06-07 IMAGING — DX DG CHEST 1V PORT
2 series · 2 of 2 positions shown · non-contrast
Comparison: March 10, 2009.

CLINICAL DATA: An 80-year-old male presents with cough.

EXAM:
PORTABLE CHEST 1 VIEW

[chest ap (1 of 2)]
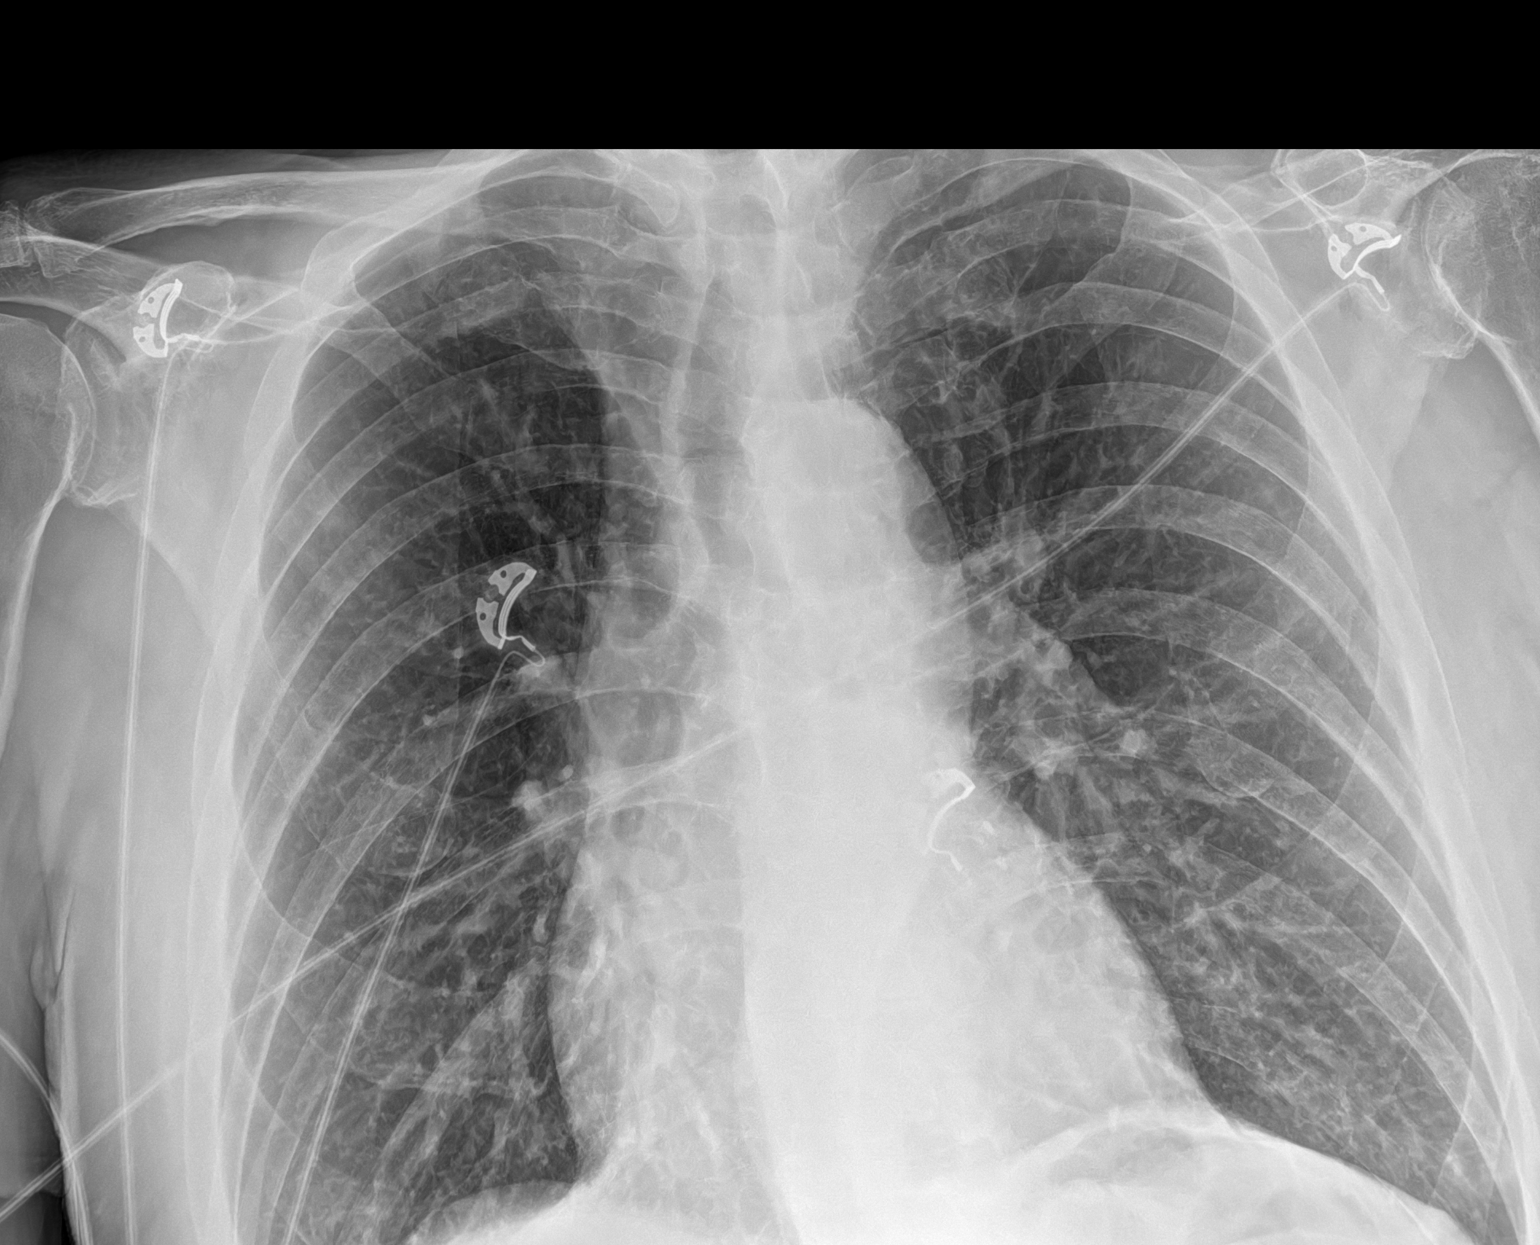

[chest ap (2 of 2)]
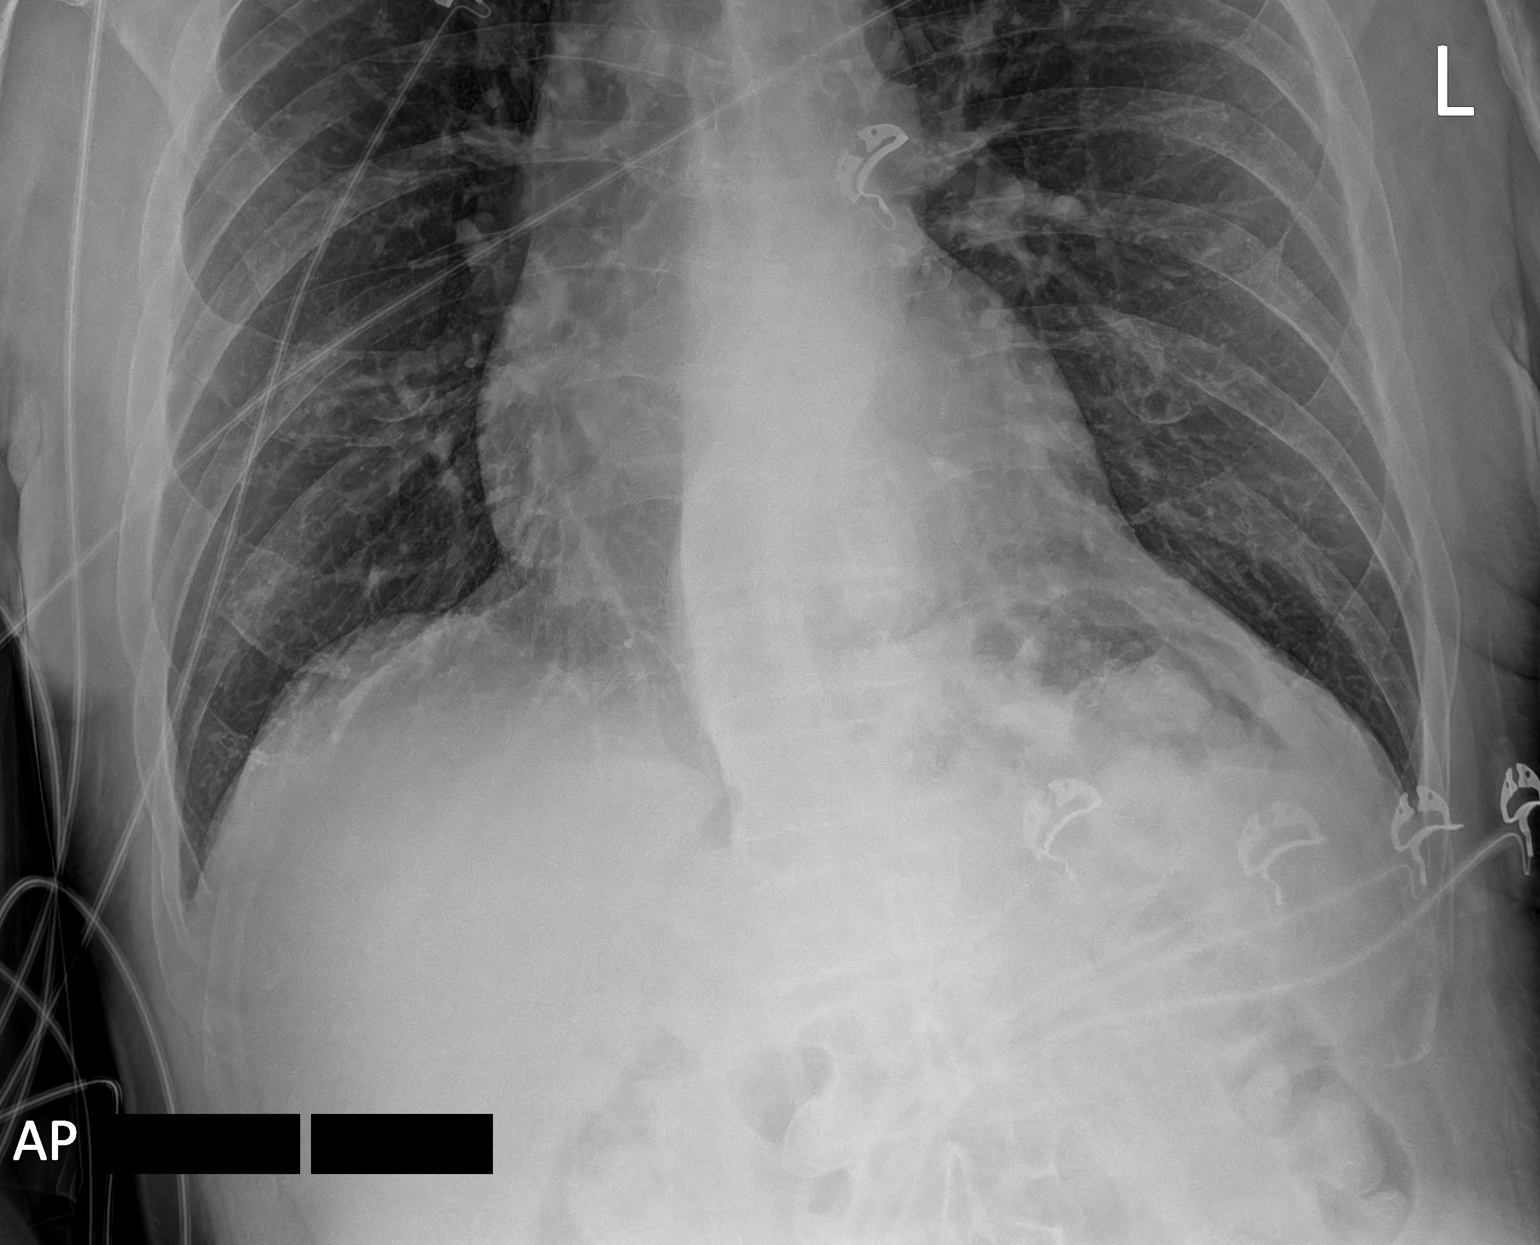

[2 of 2 positions shown; findings below may reference images not displayed]

FINDINGS: EKG leads project over the chest.  Trachea midline.

Cardiomediastinal contours and hilar structures are normal. Lungs
are clear.

On limited assessment no acute skeletal process.
IMPRESSION: No acute cardiopulmonary disease.

## 2022-06-23 ENCOUNTER — Telehealth: Payer: Self-pay | Admitting: Cardiology

## 2022-06-23 ENCOUNTER — Other Ambulatory Visit (HOSPITAL_COMMUNITY): Payer: Self-pay

## 2022-06-23 DIAGNOSIS — I2510 Atherosclerotic heart disease of native coronary artery without angina pectoris: Secondary | ICD-10-CM

## 2022-06-23 MED ORDER — ATORVASTATIN CALCIUM 40 MG PO TABS: 40.0000 mg | ORAL_TABLET | Freq: Every day | ORAL | 1 refills | Status: DC

## 2022-06-23 MED ORDER — CLOPIDOGREL BISULFATE 75 MG PO TABS: 75.0000 mg | ORAL_TABLET | Freq: Every day | ORAL | 1 refills | Status: DC

## 2022-06-23 MED ORDER — RAMIPRIL 2.5 MG PO CAPS: 2.5000 mg | ORAL_CAPSULE | Freq: Every day | ORAL | 1 refills | Status: DC

## 2022-06-23 MED ORDER — METOPROLOL TARTRATE 25 MG PO TABS: 25.0000 mg | ORAL_TABLET | Freq: Two times a day (BID) | ORAL | 1 refills | Status: DC

## 2022-06-23 MED ORDER — RAMIPRIL 2.5 MG PO CAPS
2.5000 mg | ORAL_CAPSULE | Freq: Every day | ORAL | 1 refills | Status: DC
  Filled 2022-06-23 (×2): qty 90, 90d supply, fill #0

## 2022-06-23 NOTE — Telephone Encounter (Signed)
Spoke with patient of Dr. Martinique. He accidentally threw away an entire bottle of 90 day supply of ramipril and is out. Advised prescription was sent to pharmacy on 06/23/22. He requested other refills be sent - Rx(s) sent to pharmacy electronically.  Also sent ramipril Rx to [facility] pharmacy as he said his wife can pick it up   No further assistance needed

## 2022-06-23 NOTE — Addendum Note (Signed)
Addended by: Fidel Levy on: 06/23/2022 12:55 PM   Modules accepted: Orders

## 2022-06-23 NOTE — Telephone Encounter (Signed)
*  STAT* If patient is at the pharmacy, call can be transferred to refill team.   1. Which medications need to be refilled? (please list name of each medication and dose if known)   ramipril (ALTACE) 2.5 MG capsule    2. Which pharmacy/location (including street and city if local pharmacy) is medication to be sent to? [facility] [place], [place] to [facility]   3. Do they need a 30 day or 90 day supply?  90 day  Pt accidentally through medication away. He is completely out.  Patient calling the office for samples of medication:   1.  What medication and dosage are you requesting samples for?   ramipril (ALTACE) 2.5 MG capsule    2.  Are you currently out of this medication? Yes

## 2022-09-15 NOTE — Progress Notes (Unsigned)
Zachary Liu Date of Birth: [birth year]   History of Present Illness: Zachary Liu is seen today for followup CAD. He is status post anterior myocardial infarction in September 2010. He had fairly extensive stenting of the proximal and mid LAD. He also had a long stent placed in the proximal right coronary. He had a follow up Myoview study in April 2015 showed fixed inferior and apical defects. No ischemia. EF 43%.  On follow up today he is doing very well. Weight is stable.   He denies any  chest pain, shortness of breath, or palpitations. He does walk regularly. He is now participating in an exercise class with his wife.   He denies any dizziness or fatigue. His wife is retiring this year and he is looking forward to more traveling. He has lost about 30 lbs.   Allergies as of 09/14/2021   No Known Allergies      Medication List        Accurate as of September 14, 2021  2:49 PM. If you have any questions, ask your nurse or doctor.          aspirin 81 MG tablet Take 81 mg by mouth daily.   atorvastatin 40 MG tablet Commonly known as: LIPITOR Take 1 tablet (40 mg total) by mouth daily.   azithromycin 250 MG tablet Commonly known as: ZITHROMAX Take first 2 tablets together on day 1, then take 1 tablet by  mouth every day until finished.   clopidogrel 75 MG tablet Commonly known as: PLAVIX Take 1 tablet (75 mg total) by mouth daily.   doxycycline 100 MG capsule Commonly known as: VIBRAMYCIN Take 1 capsule (100 mg total) by mouth 2 (two) times daily with a meal   metoprolol tartrate 25 MG tablet Commonly known as: LOPRESSOR TAKE 1 TABLET TWICE A DAY   multivitamin tablet Take 1 tablet by mouth daily.   nitroGLYCERIN 0.4 MG SL tablet Commonly known as: Nitrostat Place 1 tablet (0.4 mg total) under the tongue every 5 (five) minutes as needed for chest pain.   ramipril 2.5 MG capsule Commonly known as: ALTACE Take 1 capsule (2.5 mg total) by mouth daily.         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 never treated.     Past Surgical History:  Procedure Laterality Date   CARDIAC CATHETERIZATION  04/18/2009   with stenting/successful intracoronary stenting of the proximal rt coronary artery with a drug-cluting stent     CARDIAC CATHETERIZATION  03/09/2009   EF30-35%/severe 3-vessel obstructive atherosclerotic coronary artery disease,this is culprit/total occlusion of the proximal LAD/high-grade stenosis in the proximal rt coronary artery/subsequently noted there was some mod to severe stenosis in the mid LAD/successful intracoronary stenting of the proximal&mid LAD using drug-eluting stents/severe lt ventricular dysfunction   ESOPHAGOGASTRODUODENOSCOPY  03/10/2009  Mallory-Weiss tears, one of them was actively bleeding/these were injected with epinephrine with control of hemorrhage   HERNIA REPAIR Left 2013   inguinal hernia ([place])   INGUINAL HERNIA REPAIR Right 06/05/2015   Procedure: OPEN RIGHT INGUINAL HERNIA REPAIR WITH MESH;  Surgeon: Ralene Ok, MD;  Location: [facility];  Service: General;  Laterality: Right;   INSERTION OF MESH Right 06/05/2015   Procedure: INSERTION OF MESH;  Surgeon: Ralene Ok, MD;  Location: [facility];  Service: General;  Laterality: Right;   TONSILLECTOMY  1946    Social History   Tobacco Use  Smoking Status Former   Types: Cigarettes   Quit date: 07/06/1983   Years  since quitting: 38.2  Smokeless Tobacco Never    Social History   Substance and Sexual Activity  Alcohol Use No   Alcohol/week: 0.0 standard drinks    Family History  Problem Relation Age of Onset   Peripheral vascular disease Mother        bilateral leg amputations   Hyperlipidemia Mother    Stroke Mother    Valvular heart disease Brother    CAD Neg Hx    Diabetes Neg Hx    Cancer Neg Hx     Review of Systems: As noted in history of present illness. All other systems were reviewed and are negative.  Physical Exam: BP 100/62   Pulse (!) 53   Ht 6' (1.829 m)   Wt 188 lb 6.4 oz (85.5 kg)   SpO2 97%   BMI 25.55 kg/m  GENERAL:  Well appearing WM in NAD HEENT:  PERRL, EOMI, sclera are clear. Oropharynx is clear. NECK:  No jugular venous distention, carotid upstroke brisk and symmetric, no bruits, no thyromegaly or adenopathy LUNGS:  Clear to auscultation bilaterally CHEST:  Unremarkable HEART:  RRR,  PMI not displaced or sustained,S1 and S2 within normal limits, no S3, no S4: no clicks, no rubs, no murmurs ABD:  Soft, nontender. BS +, no masses or bruits. No hepatomegaly, no splenomegaly EXT:  2 + pulses throughout, no edema, no cyanosis no clubbing SKIN:  Warm and dry.  No rashes NEURO:  Alert and oriented x 3. Cranial nerves II through XII intact. PSYCH:  Cognitively intact  LABORATORY DATA:  Lab Results  Component Value Date   WBC 6.1 09/01/2020   HGB 15.5 09/01/2020   HCT 47.2 09/01/2020   MCV 91 09/01/2020   PLT 228 09/01/2020     Chemistry      Component Value Date/Time   NA 141 09/01/2020 1609   K 4.2 09/01/2020 1609   CL 102 09/01/2020 1609   CO2 24 09/01/2020 1609   BUN 12 09/01/2020 1609   CREATININE 1.21 09/01/2020 1609   CREATININE 1.15 07/15/2016 1000      Component Value Date/Time   CALCIUM 9.1 09/01/2020 1609   ALKPHOS 69 09/01/2020 1609   AST 22 09/01/2020 1609   ALT 16 09/01/2020 1609   BILITOT 0.4 09/01/2020 1609     Lab Results   Component Value Date   CHOL 154 09/01/2020   CHOL 130 08/21/2019   CHOL 145 08/15/2018   Lab Results  Component Value Date   HDL 53 09/01/2020   HDL 48 08/21/2019   HDL 51 08/15/2018   Lab Results  Component Value Date   LDLCALC 83 09/01/2020   LDLCALC 65 08/21/2019   LDLCALC 76 08/15/2018   Lab Results  Component Value Date   TRIG 95 09/01/2020   TRIG 86 08/21/2019  TRIG 91 08/15/2018   Lab Results  Component Value Date   CHOLHDL 2.9 09/01/2020   CHOLHDL 2.7 08/21/2019   CHOLHDL 2.8 08/15/2018   No results found for: LDLDIRECT   ECG today  demonstrates NSR rate 53.  LAD. No acute change.I have personally reviewed and interpreted this study.  Assessment / Plan: 1. Coronary disease with remote anterior myocardial infarction in 2010. Status post DES to the proximal and mid LAD. Later DES placement to the proximal RCA. Patient remains asymptomatic. Myoview study in April 2015 showed old scar but no ischemia.  He is completely asymptomatic.Continue current medical therapy. On long term DAPT.  2. Ischemic cardiomyopathy. EF 43%. Asymptomatic. Continue ACEi and metoprolol.   3. Hypertension-controlled.  4. Hypercholesterolemia-Continue statin therapy. Will follow up labs.   5. PVCs. Asymptomatic. Continue beta blocker  I will follow up in one year.

## 2022-09-16 ENCOUNTER — Encounter: Payer: Self-pay | Admitting: Cardiology

## 2022-09-16 ENCOUNTER — Ambulatory Visit: Payer: Medicare Other | Attending: Cardiology | Admitting: Cardiology

## 2022-09-16 VITALS — BP 118/72 | HR 54 | Ht 72.0 in | Wt 192.2 lb

## 2022-09-16 DIAGNOSIS — I2510 Atherosclerotic heart disease of native coronary artery without angina pectoris: Secondary | ICD-10-CM | POA: Insufficient documentation

## 2022-09-16 DIAGNOSIS — E7800 Pure hypercholesterolemia, unspecified: Secondary | ICD-10-CM | POA: Insufficient documentation

## 2022-09-16 DIAGNOSIS — I10 Essential (primary) hypertension: Secondary | ICD-10-CM | POA: Insufficient documentation

## 2022-09-16 LAB — CBC WITH DIFFERENTIAL/PLATELET

## 2022-09-17 ENCOUNTER — Other Ambulatory Visit: Payer: Self-pay

## 2022-09-17 ENCOUNTER — Other Ambulatory Visit (HOSPITAL_COMMUNITY): Payer: Self-pay

## 2022-09-17 DIAGNOSIS — E7800 Pure hypercholesterolemia, unspecified: Secondary | ICD-10-CM

## 2022-09-17 DIAGNOSIS — I2510 Atherosclerotic heart disease of native coronary artery without angina pectoris: Secondary | ICD-10-CM

## 2022-09-17 LAB — CBC WITH DIFFERENTIAL/PLATELET
Basophils Absolute: 0.1 10*3/uL (ref 0.0–0.2)
Basos: 1 %
EOS (ABSOLUTE): 0.1 10*3/uL (ref 0.0–0.4)
Eos: 2 %
Hematocrit: 44 % (ref 37.5–51.0)
Hemoglobin: 14.2 g/dL (ref 13.0–17.7)
Immature Grans (Abs): 0 10*3/uL (ref 0.0–0.1)
Immature Granulocytes: 0 %
Lymphocytes Absolute: 1.2 10*3/uL (ref 0.7–3.1)
Lymphs: 20 %
MCH: 29 pg (ref 26.6–33.0)
MCHC: 32.3 g/dL (ref 31.5–35.7)
MCV: 90 fL (ref 79–97)
Monocytes Absolute: 0.3 10*3/uL (ref 0.1–0.9)
Monocytes: 6 %
Neutrophils Absolute: 4.4 10*3/uL (ref 1.4–7.0)
Neutrophils: 71 %
Platelets: 235 10*3/uL (ref 150–450)
RBC: 4.89 x10E6/uL (ref 4.14–5.80)
RDW: 12.8 % (ref 11.6–15.4)
WBC: 6.2 10*3/uL (ref 3.4–10.8)

## 2022-09-17 LAB — COMPREHENSIVE METABOLIC PANEL
ALT: 11 IU/L (ref 0–44)
AST: 18 IU/L (ref 0–40)
Albumin/Globulin Ratio: 2 (ref 1.2–2.2)
Albumin: 4.1 g/dL (ref 3.7–4.7)
Alkaline Phosphatase: 69 IU/L (ref 44–121)
BUN/Creatinine Ratio: 14 (ref 10–24)
BUN: 16 mg/dL (ref 8–27)
Bilirubin Total: 0.6 mg/dL (ref 0.0–1.2)
CO2: 25 mmol/L (ref 20–29)
Calcium: 9.5 mg/dL (ref 8.6–10.2)
Chloride: 104 mmol/L (ref 96–106)
Creatinine, Ser: 1.18 mg/dL (ref 0.76–1.27)
Globulin, Total: 2.1 g/dL (ref 1.5–4.5)
Glucose: 98 mg/dL (ref 70–99)
Potassium: 4.8 mmol/L (ref 3.5–5.2)
Sodium: 140 mmol/L (ref 134–144)
Total Protein: 6.2 g/dL (ref 6.0–8.5)
eGFR: 62 mL/min/{1.73_m2} (ref >59)

## 2022-09-17 LAB — LIPID PANEL
Chol/HDL Ratio: 2.8 ratio (ref 0.0–5.0)
Cholesterol, Total: 150 mg/dL (ref 100–199)
HDL: 54 mg/dL (ref >39)
LDL Chol Calc (NIH): 81 mg/dL (ref 0–99)
Triglycerides: 79 mg/dL (ref 0–149)
VLDL Cholesterol Cal: 15 mg/dL (ref 5–40)

## 2022-09-17 MED ORDER — EZETIMIBE 10 MG PO TABS
10.0000 mg | ORAL_TABLET | Freq: Every day | ORAL | 3 refills | Status: DC
  Filled 2022-09-17: qty 90, 90d supply, fill #0
  Filled 2022-12-28: qty 90, 90d supply, fill #1
  Filled 2023-03-27: qty 90, 90d supply, fill #2
  Filled 2023-06-19: qty 90, 90d supply, fill #3

## 2022-12-23 LAB — LIPID PANEL
Chol/HDL Ratio: 2 ratio (ref 0.0–5.0)
Cholesterol, Total: 111 mg/dL (ref 100–199)
HDL: 56 mg/dL (ref >39)
LDL Chol Calc (NIH): 42 mg/dL (ref 0–99)
Triglycerides: 61 mg/dL (ref 0–149)
VLDL Cholesterol Cal: 13 mg/dL (ref 5–40)

## 2022-12-23 LAB — HEPATIC FUNCTION PANEL
ALT: 15 IU/L (ref 0–44)
AST: 22 IU/L (ref 0–40)
Albumin: 3.9 g/dL (ref 3.7–4.7)
Alkaline Phosphatase: 68 IU/L (ref 44–121)
Bilirubin Total: 0.4 mg/dL (ref 0.0–1.2)
Bilirubin, Direct: 0.17 mg/dL (ref 0.00–0.40)
Total Protein: 5.9 g/dL — ABNORMAL LOW (ref 6.0–8.5)

## 2022-12-28 ENCOUNTER — Telehealth: Payer: Self-pay | Admitting: Cardiology

## 2022-12-28 NOTE — Telephone Encounter (Signed)
Pt c/o medication issue:  1. Name of Medication: ezetimibe (ZETIA) 10 MG tablet   2. How are you currently taking this medication (dosage and times per day)? As prescribed   3. Are you having a reaction (difficulty breathing--STAT)?   4. What is your medication issue? Patient is requesting call back to make sure that Dr. [place] would like for patient to continue this medication after lab work. Please advise.

## 2022-12-28 NOTE — Telephone Encounter (Signed)
Spoke with the patient and advised him to continue his current medications.

## 2023-02-11 ENCOUNTER — Other Ambulatory Visit: Payer: Self-pay | Admitting: Cardiology

## 2023-02-25 ENCOUNTER — Other Ambulatory Visit: Payer: Self-pay | Admitting: Cardiology

## 2023-02-25 DIAGNOSIS — I2510 Atherosclerotic heart disease of native coronary artery without angina pectoris: Secondary | ICD-10-CM

## 2023-05-03 ENCOUNTER — Other Ambulatory Visit (HOSPITAL_COMMUNITY): Payer: Self-pay

## 2023-05-03 MED ORDER — DOXYCYCLINE HYCLATE 100 MG PO CAPS
100.0000 mg | ORAL_CAPSULE | Freq: Two times a day (BID) | ORAL | 0 refills | Status: DC
  Filled 2023-05-03: qty 10, 5d supply, fill #0

## 2023-05-04 ENCOUNTER — Other Ambulatory Visit (HOSPITAL_COMMUNITY): Payer: Self-pay

## 2023-05-26 ENCOUNTER — Telehealth: Payer: Self-pay

## 2023-05-26 ENCOUNTER — Other Ambulatory Visit: Payer: Self-pay | Admitting: Cardiology

## 2023-05-26 DIAGNOSIS — I2510 Atherosclerotic heart disease of native coronary artery without angina pectoris: Secondary | ICD-10-CM

## 2023-05-26 NOTE — Telephone Encounter (Signed)
Patient called in re: re-establishing care with Dr. Reece Agar, last seen  5 years ago (10.9.19)  Patient states he has been fairly healthy, but is now setting up insurance with an advantage plan where he needs a PCP  Please advise if he can re-establish, patient is ready to make an appointment for a yearly visit.  Patient can be reached at [phone]

## 2023-05-26 NOTE — Telephone Encounter (Signed)
No problem - sure please have him schedule annual exam. Thanks.

## 2023-06-22 ENCOUNTER — Other Ambulatory Visit (HOSPITAL_COMMUNITY): Payer: Self-pay

## 2023-06-28 NOTE — Telephone Encounter (Signed)
Tried to lvm to schedule an appointment, voicemail full

## 2023-08-21 ENCOUNTER — Other Ambulatory Visit: Payer: Self-pay | Admitting: Family Medicine

## 2023-08-21 DIAGNOSIS — E7800 Pure hypercholesterolemia, unspecified: Secondary | ICD-10-CM

## 2023-08-23 ENCOUNTER — Other Ambulatory Visit: Payer: Self-pay | Admitting: Cardiology

## 2023-08-23 ENCOUNTER — Other Ambulatory Visit: Payer: Self-pay | Admitting: Family Medicine

## 2023-08-23 DIAGNOSIS — I2510 Atherosclerotic heart disease of native coronary artery without angina pectoris: Secondary | ICD-10-CM

## 2023-08-24 ENCOUNTER — Other Ambulatory Visit: Payer: No Typology Code available for payment source

## 2023-08-25 MED ORDER — EZETIMIBE 10 MG PO TABS: 10.0000 mg | ORAL_TABLET | Freq: Every day | ORAL | 0 refills | Status: DC

## 2023-08-25 MED ORDER — RAMIPRIL 2.5 MG PO CAPS: 2.5000 mg | ORAL_CAPSULE | Freq: Every day | ORAL | 0 refills | Status: DC

## 2023-08-26 ENCOUNTER — Other Ambulatory Visit: Payer: Self-pay

## 2023-08-26 DIAGNOSIS — I2510 Atherosclerotic heart disease of native coronary artery without angina pectoris: Secondary | ICD-10-CM

## 2023-08-26 MED ORDER — ATORVASTATIN CALCIUM 40 MG PO TABS: 40.0000 mg | ORAL_TABLET | Freq: Every day | ORAL | 0 refills | Status: DC

## 2023-08-29 ENCOUNTER — Other Ambulatory Visit (INDEPENDENT_AMBULATORY_CARE_PROVIDER_SITE_OTHER): Payer: No Typology Code available for payment source

## 2023-08-29 DIAGNOSIS — E7800 Pure hypercholesterolemia, unspecified: Secondary | ICD-10-CM

## 2023-08-29 LAB — LIPID PANEL
Cholesterol: 115 mg/dL (ref 0–200)
HDL: 52.4 mg/dL (ref >39.00)
LDL Cholesterol: 45 mg/dL (ref 0–99)
NonHDL: 62.24
Total CHOL/HDL Ratio: 2
Triglycerides: 88 mg/dL (ref 0.0–149.0)
VLDL: 17.6 mg/dL (ref 0.0–40.0)

## 2023-08-29 LAB — COMPREHENSIVE METABOLIC PANEL
ALT: 14 U/L (ref 0–53)
AST: 18 U/L (ref 0–37)
Albumin: 3.9 g/dL (ref 3.5–5.2)
Alkaline Phosphatase: 54 U/L (ref 39–117)
BUN: 17 mg/dL (ref 6–23)
CO2: 28 meq/L (ref 19–32)
Calcium: 8.8 mg/dL (ref 8.4–10.5)
Chloride: 106 meq/L (ref 96–112)
Creatinine, Ser: 1.31 mg/dL (ref 0.40–1.50)
GFR: 50.46 mL/min — ABNORMAL LOW (ref >60.00)
Glucose, Bld: 95 mg/dL (ref 70–99)
Potassium: 4.1 meq/L (ref 3.5–5.1)
Sodium: 141 meq/L (ref 135–145)
Total Bilirubin: 0.7 mg/dL (ref 0.2–1.2)
Total Protein: 6.1 g/dL (ref 6.0–8.3)

## 2023-08-29 LAB — TSH: TSH: 5.33 u[IU]/mL (ref 0.35–5.50)

## 2023-08-31 ENCOUNTER — Encounter: Payer: Self-pay | Admitting: Family Medicine

## 2023-08-31 ENCOUNTER — Ambulatory Visit (INDEPENDENT_AMBULATORY_CARE_PROVIDER_SITE_OTHER): Payer: No Typology Code available for payment source | Admitting: Family Medicine

## 2023-08-31 VITALS — BP 126/72 | HR 52 | Temp 98.2°F | Ht 71.0 in | Wt 198.5 lb

## 2023-08-31 DIAGNOSIS — I10 Essential (primary) hypertension: Secondary | ICD-10-CM | POA: Diagnosis not present

## 2023-08-31 DIAGNOSIS — I2510 Atherosclerotic heart disease of native coronary artery without angina pectoris: Secondary | ICD-10-CM | POA: Diagnosis not present

## 2023-08-31 DIAGNOSIS — Z23 Encounter for immunization: Secondary | ICD-10-CM | POA: Diagnosis not present

## 2023-08-31 DIAGNOSIS — Z7189 Other specified counseling: Secondary | ICD-10-CM

## 2023-08-31 DIAGNOSIS — H9193 Unspecified hearing loss, bilateral: Secondary | ICD-10-CM

## 2023-08-31 DIAGNOSIS — E7800 Pure hypercholesterolemia, unspecified: Secondary | ICD-10-CM

## 2023-08-31 DIAGNOSIS — Z0000 Encounter for general adult medical examination without abnormal findings: Secondary | ICD-10-CM

## 2023-08-31 DIAGNOSIS — I255 Ischemic cardiomyopathy: Secondary | ICD-10-CM

## 2023-08-31 NOTE — Assessment & Plan Note (Signed)
 Sees cardiology on acei and bb

## 2023-08-31 NOTE — Assessment & Plan Note (Signed)
 Advanced directive discussion - doesn't have this. Working on this. Packet provided today. Would want wife to be HCPOA. Full code. Ok with feeding tube.

## 2023-08-31 NOTE — Patient Instructions (Addendum)
 Hearing screen showed some hearing loss - let me know if interested in seeing audiologist.  Try to get dates of recent vaccines to update your chart.  Prevnar-20 today.  Good to see you today Return in 1 year for next physical/wellness visit

## 2023-08-31 NOTE — Assessment & Plan Note (Signed)

## 2023-08-31 NOTE — Assessment & Plan Note (Signed)
 Chronic, stable on atorvastatin and zetia - continue. The ASCVD Risk score (Arnett DK, et al., 2019) failed to calculate for the following reasons:   The 2019 ASCVD risk score is only valid for ages 33 to 3   Risk score cannot be calculated because patient has a medical history suggesting prior/existing ASCVD

## 2023-08-31 NOTE — Assessment & Plan Note (Signed)
 R>L. He will let me know if desires audiology evaluation

## 2023-08-31 NOTE — Assessment & Plan Note (Signed)
 Preventative protocols reviewed and updated unless pt declined. Discussed healthy diet and lifestyle.

## 2023-08-31 NOTE — Assessment & Plan Note (Addendum)
 Sees cardiology on plavix and aspirin and statin.

## 2023-08-31 NOTE — Assessment & Plan Note (Signed)
 Chronic, stable. Continue current regimen.

## 2023-08-31 NOTE — Progress Notes (Signed)
 Ph: [phone] Fax: [phone]   Patient ID: Zachary Liu, male    DOB: [birth year], 83 y.o.   MRN: [id number]  This visit was conducted in person.  BP 126/72   Pulse (!) 52   Temp 98.2 F (36.8 C) (Oral)   Ht 5\' 11"  (1.803 m)   Wt 198 lb 8 oz (90 kg)   SpO2 98%   BMI 27.69 kg/m   Pulse Readings from Last 3 Encounters:  08/31/23 (!) 52  09/16/22 (!) 54  09/14/21 (!) 53    BP Readings from Last 3 Encounters:  08/31/23 126/72  09/16/22 118/72  09/14/21 100/62   CC: CPE/AMW Subjective:   HPI: Zachary Liu is a 83 y.o. male presenting on 08/31/2023 for Medicare Wellness   Last seen 2019.  Very healthy 82yo.  Does have medicare A/B.  Has not seen health advisor.   Hearing Screening   500Hz  1000Hz  2000Hz  4000Hz   Right ear 0 0 0 40  Left ear 40 0 40 0  Vision Screening - Comments:: Last eye exam, 07/2023.  Flowsheet Row Office Visit from [facility]/31/2023 in [facility]  PHQ-2 Total Score 0     Denies trouble hearing. Declines audiology eval.     08/31/2023    8:54 AM  Fall Risk   Falls in the past year? 0    H/o CAD with remote anterior MI 2010 s/p DES x3 with residual ischemic CM EF 43%. Sees cardiologist yearly ([place]). LDL goal <70  Preventative: Colon cancer screening - aged out. No blood in stool  Prostate cancer screening - aged out. Nocturia x1-2.  Lung cancer screening - not eligible [facility] COVID shot - regularly Tetanus shot - discussed insurance coverage Pneumonia shot - prevnar-20 today Shingrix - completed through [facility] last month Advanced directive discussion - doesn't have this. Working on this. Packet provided today. Would want wife to be HCPOA. Full code. Ok with feeding tube. Seat belt use discussed Sunscreen use discussed. No changing moles on skin. Sees dermatology yearly Zachary Liu).  Sleep -averaging 7-8 hours/night Non smoker Alcohol - none in 20 yrs Dentist - has  not seen - full upper and partial lower dentures Eye exam - yearly  Bowel - no constipation - managed with colace Bladder - no incontinence  Moved from [place]. Lives with wife Amy Occ: Retired Charity fundraiser Edu: RN Activity: walking 1 mi 3x/wk at [employer], golf regularly in warm weather Diet: good water, fruits/vegetables daily     Relevant past medical, surgical, family and social history reviewed and updated as indicated. Interim medical history since our last visit reviewed. Allergies and medications reviewed and updated. Outpatient Medications Prior to Visit  Medication Sig Dispense Refill   aspirin 81 MG tablet Take 81 mg by mouth daily.     atorvastatin (LIPITOR) 40 MG tablet Take 1 tablet (40 mg total) by mouth daily. 90 tablet 0   clopidogrel (PLAVIX) 75 MG tablet TAKE 1 TABLET DAILY 90 tablet 1   ezetimibe (ZETIA) 10 MG tablet Take 1 tablet (10 mg total) by mouth daily. 90 tablet 0   metoprolol tartrate (LOPRESSOR) 25 MG tablet TAKE 1 TABLET TWICE A DAY 180 tablet 2   Multiple Vitamin (MULTIVITAMIN) tablet Take 1 tablet by mouth daily.     nitroGLYCERIN (NITROSTAT) 0.4 MG SL tablet Place 1 tablet (0.4 mg total) under the tongue every 5 (five) minutes as needed for chest pain.  25 tablet 2   ramipril (ALTACE) 2.5 MG capsule Take 1 capsule (2.5 mg total) by mouth daily. 90 capsule 0   doxycycline (VIBRAMYCIN) 100 MG capsule Take 1 capsule by mouth twice a day 10 capsule 0   No facility-administered medications prior to visit.     Per HPI unless specifically indicated in ROS section below Review of Systems  Constitutional:  Negative for activity change, appetite change, chills, fatigue, fever and unexpected weight change.  HENT:  Negative for hearing loss.   Eyes:  Negative for visual disturbance.  Respiratory:  Negative for cough, chest tightness, shortness of breath and wheezing.   Cardiovascular:  Negative for chest pain, palpitations and leg swelling.  Gastrointestinal:   Negative for abdominal distention, abdominal pain, blood in stool, constipation, diarrhea, nausea and vomiting.  Genitourinary:  Negative for difficulty urinating and hematuria.  Musculoskeletal:  Negative for arthralgias, myalgias and neck pain.  Skin:  Negative for rash.  Neurological:  Negative for dizziness, seizures, syncope and headaches.  Hematological:  Negative for adenopathy. Does not bruise/bleed easily.  Psychiatric/Behavioral:  Negative for dysphoric mood. The patient is not nervous/anxious.     Objective:  BP 126/72   Pulse (!) 52   Temp 98.2 F (36.8 C) (Oral)   Ht 5\' 11"  (1.803 m)   Wt 198 lb 8 oz (90 kg)   SpO2 98%   BMI 27.69 kg/m   Wt Readings from Last 3 Encounters:  08/31/23 198 lb 8 oz (90 kg)  09/16/22 192 lb 3.2 oz (87.2 kg)  09/14/21 188 lb 6.4 oz (85.5 kg)      Physical Exam Vitals and nursing note reviewed.  Constitutional:      General: He is not in acute distress.    Appearance: Normal appearance. He is well-developed. He is not ill-appearing.  HENT:     Head: Normocephalic and atraumatic.     Right Ear: Hearing, tympanic membrane, ear canal and external ear normal.     Left Ear: Hearing, tympanic membrane, ear canal and external ear normal.     Mouth/Throat:     Mouth: Mucous membranes are moist.     Pharynx: Oropharynx is clear. No oropharyngeal exudate or posterior oropharyngeal erythema.  Eyes:     General: No scleral icterus.    Extraocular Movements: Extraocular movements intact.     Conjunctiva/sclera: Conjunctivae normal.     Pupils: Pupils are equal, round, and reactive to light.  Neck:     Thyroid: No thyroid mass or thyromegaly.     Vascular: No carotid bruit.  Cardiovascular:     Rate and Rhythm: Normal rate and regular rhythm.     Pulses: Normal pulses.          Radial pulses are 2+ on the right side and 2+ on the left side.     Heart sounds: Normal heart sounds. No murmur heard. Pulmonary:     Effort: Pulmonary effort is  normal. No respiratory distress.     Breath sounds: Normal breath sounds. No wheezing, rhonchi or rales.  Abdominal:     General: Bowel sounds are normal. There is no distension.     Palpations: Abdomen is soft. There is no mass.     Tenderness: There is no abdominal tenderness. There is no guarding or rebound.     Hernia: No hernia is present.  Musculoskeletal:        General: Normal range of motion.     Cervical back: Normal range of motion and  neck supple.     Right lower leg: No edema.     Left lower leg: No edema.  Lymphadenopathy:     Cervical: No cervical adenopathy.  Skin:    General: Skin is warm and dry.     Findings: No rash.  Neurological:     General: No focal deficit present.     Mental Status: He is alert and oriented to person, place, and time.     Comments:  Recall 2/3, 3/3 with cue Calculation 5/5 DLROW  Psychiatric:        Mood and Affect: Mood normal.        Behavior: Behavior normal.        Thought Content: Thought content normal.        Judgment: Judgment normal.       Results for orders placed or performed in visit on 08/29/23  TSH   Collection Time: 08/29/23  8:51 AM  Result Value Ref Range   TSH 5.33 0.35 - 5.50 uIU/mL  Comprehensive metabolic panel   Collection Time: 08/29/23  8:51 AM  Result Value Ref Range   Sodium 141 135 - 145 mEq/L   Potassium 4.1 3.5 - 5.1 mEq/L   Chloride 106 96 - 112 mEq/L   CO2 28 19 - 32 mEq/L   Glucose, Bld 95 70 - 99 mg/dL   BUN 17 6 - 23 mg/dL   Creatinine, Ser 2.59 0.40 - 1.50 mg/dL   Total Bilirubin 0.7 0.2 - 1.2 mg/dL   Alkaline Phosphatase 54 39 - 117 U/L   AST 18 0 - 37 U/L   ALT 14 0 - 53 U/L   Total Protein 6.1 6.0 - 8.3 g/dL   Albumin 3.9 3.5 - 5.2 g/dL   GFR 56.38 (L) >75.64 mL/min   Calcium 8.8 8.4 - 10.5 mg/dL  Lipid panel   Collection Time: 08/29/23  8:51 AM  Result Value Ref Range   Cholesterol 115 0 - 200 mg/dL   Triglycerides 33.2 0.0 - 149.0 mg/dL   HDL 95.18 >84.16 mg/dL   VLDL 60.6  0.0 - 30.1 mg/dL   LDL Cholesterol 45 0 - 99 mg/dL   Total CHOL/HDL Ratio 2    NonHDL 62.24     Assessment & Plan:   Problem List Items Addressed This Visit     Medicare annual wellness visit, subsequent - Primary (Chronic)   I have personally reviewed the Medicare Annual Wellness questionnaire and have noted 1. The patient's medical and social history 2. Their use of alcohol, tobacco or illicit drugs 3. Their current medications and supplements 4. The patient's functional ability including ADL's, fall risks, home safety risks and hearing or visual impairment. Cognitive function has been assessed and addressed as indicated.  5. Diet and physical activity 6. Evidence for depression or mood disorders The patients weight, height, BMI have been recorded in the chart. I have made referrals, counseling and provided education to the patient based on review of the above and I have provided the pt with a written personalized care plan for preventive services. Provider list updated.. See scanned questionairre as needed for further documentation. Reviewed preventative protocols and updated unless pt declined.       Health maintenance examination (Chronic)   Preventative protocols reviewed and updated unless pt declined. Discussed healthy diet and lifestyle.       Advanced directives, counseling/discussion (Chronic)   Advanced directive discussion - doesn't have this. Working on this. Packet provided today. Would want wife to  be HCPOA. Full code. Ok with feeding tube.      Hyperlipidemia   Chronic, stable on atorvastatin and zetia - continue. The ASCVD Risk score (Arnett DK, et al., 2019) failed to calculate for the following reasons:   The 2019 ASCVD risk score is only valid for ages 68 to 9   Risk score cannot be calculated because patient has a medical history suggesting prior/existing ASCVD       Coronary artery disease   Sees cardiology on plavix and aspirin and statin.        Ischemic cardiomyopathy   Sees cardiology on acei and bb      HTN (hypertension)   Chronic, stable. Continue current regimen.       Decreased hearing of both ears   R>L. He will let me know if desires audiology evaluation      Other Visit Diagnoses       Need for vaccination against Streptococcus pneumoniae       Relevant Orders   Pneumococcal conjugate vaccine 20-valent (Completed)        No orders of the defined types were placed in this encounter.   Orders Placed This Encounter  Procedures   Pneumococcal conjugate vaccine 20-valent    Patient Instructions  Hearing screen showed some hearing loss - let me know if interested in seeing audiologist.  Try to get dates of recent vaccines to update your chart.  Prevnar-20 today.  Good to see you today Return in 1 year for next physical/wellness visit   Follow up plan: Return in about 1 year (around 08/30/2024) for annual exam, prior fasting for blood work, medicare wellness visit.  Eustaquio Boyden, MD

## 2023-09-01 ENCOUNTER — Telehealth: Payer: Self-pay | Admitting: Family Medicine

## 2023-09-01 ENCOUNTER — Telehealth: Payer: Self-pay | Admitting: Cardiology

## 2023-09-01 DIAGNOSIS — I2510 Atherosclerotic heart disease of native coronary artery without angina pectoris: Secondary | ICD-10-CM

## 2023-09-01 MED ORDER — CLOPIDOGREL BISULFATE 75 MG PO TABS: 75.0000 mg | ORAL_TABLET | Freq: Every day | ORAL | 0 refills | Status: DC

## 2023-09-01 NOTE — Telephone Encounter (Signed)
 Copied from CRM [id number]. Topic: General - Other >> Sep 01, 2023 12:54 PM Irine Seal wrote: Patient calling back in to add - flu shot 04/09/23

## 2023-09-01 NOTE — Telephone Encounter (Signed)
*  STAT* If patient is at the pharmacy, call can be transferred to refill team.   1. Which medications need to be refilled? (please list name of each medication and dose if known) clopidogrel (PLAVIX) 75 MG tablet    2. Would you like to learn more about the convenience, safety, & potential cost savings by using the [facility]?     3. Are you open to using the [facility] (Type [facility]. ).   4. Which pharmacy/location (including street and city if local pharmacy) is medication to be sent to? [facility] [place], [place] - [address] [facility] AT Portal to Registered [facility] Sites    5. Do they need a 30 day or 90 day supply? 90

## 2023-09-01 NOTE — Telephone Encounter (Signed)
 Refill sent to preferred pharmacy.

## 2023-09-01 NOTE — Telephone Encounter (Signed)
 Have updated chart.  No further action needed at this time.

## 2023-09-15 NOTE — Progress Notes (Signed)
 Zachary Liu Date of Birth: 10-Aug-1940   History of Present Illness: Zachary Liu is seen today for followup CAD. He is status post anterior myocardial infarction in September 2010. He had fairly extensive stenting of the proximal and mid LAD. He also had a long stent placed in the proximal right coronary. He had a follow up Myoview study in April 2015 showed fixed inferior and apical defects. No ischemia. EF 43%.  On follow up today he is doing very well. He denies any chest pain or SOB. No palpitations. He goes to [employer] 2-3 days/wk.   He does walk regularly.   Allergies as of 09/19/2023   No Known Allergies      Medication List        Accurate as of September 19, 2023 10:04 AM. If you have any questions, ask your nurse or doctor.          aspirin 81 MG tablet Take 81 mg by mouth daily.   atorvastatin 40 MG tablet Commonly known as: LIPITOR Take 1 tablet (40 mg total) by mouth daily.   clopidogrel 75 MG tablet Commonly known as: PLAVIX Take 1 tablet (75 mg total) by mouth daily.   ezetimibe 10 MG tablet Commonly known as: ZETIA Take 1 tablet (10 mg total) by mouth daily.   metoprolol tartrate 25 MG tablet Commonly known as: LOPRESSOR TAKE 1 TABLET TWICE A DAY   multivitamin tablet Take 1 tablet by mouth daily.   nitroGLYCERIN 0.4 MG SL tablet Commonly known as: Nitrostat Place 1 tablet (0.4 mg total) under the tongue every 5 (five) minutes as needed for chest pain.   ramipril 2.5 MG capsule Commonly known as: ALTACE Take 1 capsule (2.5 mg total) by mouth daily.        No Known Allergies  Past Medical History:  Diagnosis Date   Basal cell carcinoma 05/2019, 08/2019, 05/2020, 11/2020, 05/2021   multiple - R arm, R chest, B tibia, forearm/tibia/scalp, shoulder/supraclavicular/tibia ([place])   Coronary artery disease    COVID-19 virus infection 03/2021   GI bleed    due to a Mallory Weiss tear during heart attack   History of anemia     after GIB from [facility] 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 never treated.     Past Surgical History:  Procedure Laterality Date   CARDIAC CATHETERIZATION  04/18/2009   with stenting/successful intracoronary stenting of the proximal rt coronary artery with a drug-cluting stent     CARDIAC CATHETERIZATION  03/09/2009   EF30-35%/severe 3-vessel obstructive atherosclerotic coronary artery disease,this is culprit/total occlusion of the proximal LAD/high-grade stenosis in the proximal rt coronary artery/subsequently noted there was some mod to severe stenosis in the mid LAD/successful intracoronary stenting of the proximal&mid LAD using drug-eluting stents/severe lt ventricular dysfunction   ESOPHAGOGASTRODUODENOSCOPY  03/10/2009   Mallory-Weiss tears, one of them was actively bleeding/these were injected with epinephrine with control of hemorrhage   HERNIA REPAIR Left 2013   inguinal hernia ([place])   INGUINAL HERNIA REPAIR Right 06/05/2015   Procedure: OPEN RIGHT INGUINAL HERNIA REPAIR WITH MESH;  Surgeon: Axel Filler, MD;  Location: [facility] OR;  Service: General;  Laterality: Right;   INSERTION OF MESH Right 06/05/2015   Procedure: INSERTION  OF MESH;  Surgeon: Axel Filler, MD;  Location: [facility] OR;  Service: General;  Laterality: Right;   TONSILLECTOMY  1946    Social History   Tobacco Use  Smoking Status Former   Current packs/day: 0.00   Types: Cigarettes   Quit date: 07/06/1983   Years since quitting: 40.2  Smokeless Tobacco Never    Social History   Substance and Sexual Activity  Alcohol Use No   Alcohol/week: 0.0 standard drinks of alcohol    Family History  Problem Relation Age of Onset   Peripheral vascular disease Mother        bilateral leg amputations   Hyperlipidemia Mother     Stroke Mother    Valvular heart disease Brother    CAD Neg Hx    Diabetes Neg Hx    Cancer Neg Hx     Review of Systems: As noted in history of present illness. All other systems were reviewed and are negative.  Physical Exam: BP 116/62   Ht 6' (1.829 m)   Wt 194 lb 3.2 oz (88.1 kg)   SpO2 97%   BMI 26.34 kg/m  GENERAL:  Well appearing WM in NAD HEENT:  PERRL, EOMI, sclera are clear. Oropharynx is clear. NECK:  No jugular venous distention, carotid upstroke brisk and symmetric, no bruits, no thyromegaly or adenopathy LUNGS:  Clear to auscultation bilaterally CHEST:  Unremarkable HEART:  RRR,  PMI not displaced or sustained,S1 and S2 within normal limits, no S3, no S4: no clicks, no rubs, no murmurs ABD:  Soft, nontender. BS +, no masses or bruits. No hepatomegaly, no splenomegaly EXT:  2 + pulses throughout, no edema, no cyanosis no clubbing SKIN:  Warm and dry.  No rashes NEURO:  Alert and oriented x 3. Cranial nerves II through XII intact. PSYCH:  Cognitively intact  LABORATORY DATA:  Lab Results  Component Value Date   WBC 6.2 09/16/2022   HGB 14.2 09/16/2022   HCT 44.0 09/16/2022   MCV 90 09/16/2022   PLT 235 09/16/2022     Chemistry      Component Value Date/Time   NA 141 08/29/2023 0851   NA 140 09/16/2022 1149   K 4.1 08/29/2023 0851   CL 106 08/29/2023 0851   CO2 28 08/29/2023 0851   BUN 17 08/29/2023 0851   BUN 16 09/16/2022 1149   CREATININE 1.31 08/29/2023 0851   CREATININE 1.15 07/15/2016 1000      Component Value Date/Time   CALCIUM 8.8 08/29/2023 0851   ALKPHOS 54 08/29/2023 0851   AST 18 08/29/2023 0851   ALT 14 08/29/2023 0851   BILITOT 0.7 08/29/2023 0851   BILITOT 0.4 12/23/2022 1117     Lab Results  Component Value Date   CHOL 115 08/29/2023   CHOL 111 12/23/2022   CHOL 150 09/16/2022   Lab Results  Component Value Date   HDL 52.40 08/29/2023   HDL 56 12/23/2022   HDL 54 09/16/2022   Lab Results  Component Value Date    LDLCALC 45 08/29/2023   LDLCALC 42 12/23/2022   LDLCALC 81 09/16/2022   Lab Results  Component Value Date   TRIG 88.0 08/29/2023   TRIG 61 12/23/2022   TRIG 79 09/16/2022   Lab Results  Component Value Date   CHOLHDL 2 08/29/2023   CHOLHDL 2.0 12/23/2022   CHOLHDL 2.8 09/16/2022   No results found for: "LDLDIRECT"   EKG Interpretation Date/Time:  Monday September 19 2023 09:39:47 EDT Ventricular Rate:  55 PR Interval:  168 QRS Duration:  120 QT Interval:  452 QTC Calculation: 432 R Axis:   -88  Text Interpretation: Sinus bradycardia with Fusion complexes Left axis deviation Right bundle branch block When compared with ECG of March 14,2024 RBBB is new. Confirmed by [place], Uno Esau [phone]) on 09/19/2023 9:58:32 AM    Assessment / Plan: 1. Coronary disease with remote anterior myocardial infarction in 2010. Status post DES to the proximal and mid LAD. Later DES placement to the proximal RCA.  - Myoview in 2015 was normal. - asymptomatic. - continue DAPT long term due to multiple stents.    2. Ischemic cardiomyopathy. EF 43%. Asymptomatic. Continue ACEi and metoprolol.   3. Hypertension-controlled.  4. Hypercholesterolemia-Continue statin therapy.  Last LDL 45  5. PVCs. Asymptomatic. Continue beta blocker  6. RBBB - new since last year. Asymptomatic. benign  I will follow up in one year.

## 2023-09-19 ENCOUNTER — Ambulatory Visit: Payer: Medicare Other | Attending: Cardiology | Admitting: Cardiology

## 2023-09-19 ENCOUNTER — Encounter: Payer: Self-pay | Admitting: Cardiology

## 2023-09-19 VITALS — BP 116/62 | Ht 72.0 in | Wt 194.2 lb

## 2023-09-19 DIAGNOSIS — I2510 Atherosclerotic heart disease of native coronary artery without angina pectoris: Secondary | ICD-10-CM | POA: Diagnosis not present

## 2023-09-19 DIAGNOSIS — I10 Essential (primary) hypertension: Secondary | ICD-10-CM | POA: Diagnosis not present

## 2023-09-19 DIAGNOSIS — E7800 Pure hypercholesterolemia, unspecified: Secondary | ICD-10-CM

## 2023-09-19 NOTE — Patient Instructions (Signed)
Medication Instructions:  Continue same medications *If you need a refill on your cardiac medications before your next appointment, please call your pharmacy*   Lab Work: None ordered   Testing/Procedures: None  ordered   Follow-Up: At [facility], you and your health needs are our priority.  As part of our continuing mission to provide you with exceptional heart care, we have created designated Provider Care Teams.  These Care Teams include your primary Cardiologist (physician) and Advanced Practice Providers (APPs -  Physician Assistants and Nurse Practitioners) who all work together to provide you with the care you need, when you need it.  We recommend signing up for the patient portal called "MyChart".  Sign up information is provided on this After Visit Summary.  MyChart is used to connect with patients for Virtual Visits (Telemedicine).  Patients are able to view lab/test results, encounter notes, upcoming appointments, etc.  Non-urgent messages can be sent to your provider as well.   To learn more about what you can do with MyChart, go to [email or web address].    Your next appointment:  1 year     Call in Dec to schedule March appointment    Provider:  Dr.Jordan

## 2023-09-25 ENCOUNTER — Other Ambulatory Visit: Payer: Self-pay | Admitting: Cardiology

## 2023-10-06 ENCOUNTER — Other Ambulatory Visit (HOSPITAL_COMMUNITY): Payer: Self-pay

## 2023-10-06 NOTE — Telephone Encounter (Signed)
 Is anything still needed with this request, if not, please sign off on rx in this encounter as PA team is unable to resolve RX requests. Thank you

## 2023-11-17 ENCOUNTER — Other Ambulatory Visit: Payer: Self-pay

## 2023-11-17 DIAGNOSIS — I2510 Atherosclerotic heart disease of native coronary artery without angina pectoris: Secondary | ICD-10-CM

## 2023-11-17 MED ORDER — RAMIPRIL 2.5 MG PO CAPS: 2.5000 mg | ORAL_CAPSULE | Freq: Every day | ORAL | 3 refills | Status: AC

## 2023-11-17 MED ORDER — CLOPIDOGREL BISULFATE 75 MG PO TABS: 75.0000 mg | ORAL_TABLET | Freq: Every day | ORAL | 3 refills | Status: AC

## 2023-11-17 MED ORDER — ATORVASTATIN CALCIUM 40 MG PO TABS: 40.0000 mg | ORAL_TABLET | Freq: Every day | ORAL | 3 refills | Status: AC

## 2023-12-04 DIAGNOSIS — C44722 Squamous cell carcinoma of skin of right lower limb, including hip: Secondary | ICD-10-CM

## 2023-12-04 HISTORY — DX: Squamous cell carcinoma of skin of right lower limb, including hip: C44.722

## 2023-12-16 DIAGNOSIS — Z85828 Personal history of other malignant neoplasm of skin: Secondary | ICD-10-CM | POA: Diagnosis not present

## 2023-12-16 DIAGNOSIS — L821 Other seborrheic keratosis: Secondary | ICD-10-CM | POA: Diagnosis not present

## 2023-12-16 DIAGNOSIS — L814 Other melanin hyperpigmentation: Secondary | ICD-10-CM | POA: Diagnosis not present

## 2023-12-16 DIAGNOSIS — D225 Melanocytic nevi of trunk: Secondary | ICD-10-CM | POA: Diagnosis not present

## 2023-12-16 DIAGNOSIS — C44722 Squamous cell carcinoma of skin of right lower limb, including hip: Secondary | ICD-10-CM | POA: Diagnosis not present

## 2023-12-16 DIAGNOSIS — L570 Actinic keratosis: Secondary | ICD-10-CM | POA: Diagnosis not present

## 2023-12-23 ENCOUNTER — Encounter: Payer: Self-pay | Admitting: Family Medicine

## 2023-12-23 ENCOUNTER — Other Ambulatory Visit: Payer: Self-pay | Admitting: *Deleted

## 2023-12-23 MED ORDER — EZETIMIBE 10 MG PO TABS: 10.0000 mg | ORAL_TABLET | Freq: Every day | ORAL | 2 refills | Status: AC

## 2024-01-18 ENCOUNTER — Other Ambulatory Visit (HOSPITAL_COMMUNITY): Payer: Self-pay

## 2024-01-18 DIAGNOSIS — C44722 Squamous cell carcinoma of skin of right lower limb, including hip: Secondary | ICD-10-CM | POA: Diagnosis not present

## 2024-01-18 DIAGNOSIS — Z85828 Personal history of other malignant neoplasm of skin: Secondary | ICD-10-CM | POA: Diagnosis not present

## 2024-01-18 MED ORDER — DOXYCYCLINE HYCLATE 100 MG PO CAPS
100.0000 mg | ORAL_CAPSULE | Freq: Two times a day (BID) | ORAL | 0 refills | Status: AC
  Filled 2024-01-18: qty 14, 7d supply, fill #0

## 2024-08-24 ENCOUNTER — Other Ambulatory Visit

## 2024-08-31 ENCOUNTER — Encounter: Admitting: Family Medicine

## 2024-09-19 ENCOUNTER — Ambulatory Visit: Admitting: Cardiology

## 2024-09-26 ENCOUNTER — Ambulatory Visit
